# Patient Record
Sex: Male | Born: 1990 | Hispanic: No | Marital: Married | Smoking: Current some day smoker
Health system: Southern US, Community
[De-identification: ages and names within clinical notes are randomized; demographics above are authoritative.]

## PROBLEM LIST (undated history)

## (undated) HISTORY — PX: OTHER SURGICAL HISTORY: SHX169

---

## 2018-10-09 ENCOUNTER — Encounter: Payer: Self-pay | Admitting: Family Medicine

## 2018-10-09 ENCOUNTER — Ambulatory Visit (INDEPENDENT_AMBULATORY_CARE_PROVIDER_SITE_OTHER): Payer: Self-pay | Admitting: Family Medicine

## 2018-10-09 DIAGNOSIS — N469 Male infertility, unspecified: Secondary | ICD-10-CM | POA: Insufficient documentation

## 2018-10-09 NOTE — Progress Notes (Signed)
    Chief Complaint:  Wayne Long is a 28 y.o. male who presents for a telephone visit with a chief complaint of infertility and to establish care.   Assessment/Plan:  Infertility Has had previous semen analysis which is normal.  Will check requested lab work.  See orders section for further details.    Subjective:  HPI:  Infertility Patient has been trying to conceive a child with his wife for the past 5 years. They have not been successful. He has had work up in the past including semen analysis which was reportedly normal. They have been seeing a "natural" fertility specialist who recommended that he have additional blood work done.   ROS: Per HPI, otherwise a complete review of systems was negative.   PMH:  The following were reviewed and entered/updated in epic: History reviewed. No pertinent past medical history. Patient Active Problem List   Diagnosis Date Noted  . 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 Arm surgery      Family History  Problem Relation Age of Onset  . Diabetes Mother     Medications- reviewed and updated No current outpatient medications on file.   No current facility-administered medications for this visit.     Allergies-reviewed and updated Not on File  Social History   Socioeconomic History  . Marital status: Married    Spouse name: Not on file  . Number of children: Not on file  . Years of education: Not on file  . Highest education level: Not on file  Occupational History  . Not on file  Social Needs  . Financial resource strain: Not on file  . Food insecurity    Worry: Not on file    Inability: Not on file  . Transportation needs    Medical: Not on file    Non-medical: Not on file  Tobacco Use  . Smoking status: Current Some Day Smoker  . Smokeless tobacco: Never Used  Substance and Sexual Activity  . Alcohol use: Yes  . Drug use: Never  . Sexual activity: Not on file   Lifestyle  . Physical activity    Days per week: Not on file    Minutes per session: Not on file  . Stress: Not on file  Relationships  . Social Herbalist on phone: Not on file    Gets together: Not on file    Attends religious service: Not on file    Active member of club or organization: Not on file    Attends meetings of clubs or organizations: Not on file    Relationship status: Not on file  Other Topics Concern  . Not on file  Social History Narrative  . Not on file         Objective/Observations   NAD, Speaking in full sentences  Telephone Visit   I connected with Wayne Long on 10/09/18 at  9:20 AM EDT via telephone and verified that I am speaking with the correct person using two identifiers. I discussed the limitations of evaluation and management by telemedicine and the availability of in person appointments. The patient expressed understanding and agreed to proceed.   Patient location: Home Provider location: [place] participating in the virtual visit: Myself and Patient  A total of 20 minutes were spent on medical discussion.      Algis Greenhouse. Jerline Pain, MD 10/09/2018 9:41 AM

## 2018-10-10 ENCOUNTER — Other Ambulatory Visit: Payer: Self-pay

## 2018-10-10 LAB — CBC
HCT: 43 % (ref 39.0–52.0)
Hemoglobin: 14.4 g/dL (ref 13.0–17.0)
MCHC: 33.5 g/dL (ref 30.0–36.0)
MCV: 88.8 fl (ref 78.0–100.0)
Platelets: 168 10*3/uL (ref 150.0–400.0)
RBC: 4.84 Mil/uL (ref 4.22–5.81)
RDW: 13 % (ref 11.5–15.5)
WBC: 9.9 10*3/uL (ref 4.0–10.5)

## 2018-10-10 LAB — LIPID PANEL
Cholesterol: 147 mg/dL (ref 0–200)
HDL: 35.7 mg/dL — ABNORMAL LOW (ref >39.00)
LDL Cholesterol: 100 mg/dL — ABNORMAL HIGH (ref 0–99)
NonHDL: 111.3
Total CHOL/HDL Ratio: 4
Triglycerides: 58 mg/dL (ref 0.0–149.0)
VLDL: 11.6 mg/dL (ref 0.0–40.0)

## 2018-10-10 LAB — VITAMIN D 25 HYDROXY (VIT D DEFICIENCY, FRACTURES): VITD: 12.09 ng/mL — ABNORMAL LOW (ref 30.00–100.00)

## 2018-10-10 LAB — T3, FREE: T3, Free: 3.2 pg/mL (ref 2.3–4.2)

## 2018-10-10 LAB — COMPREHENSIVE METABOLIC PANEL
ALT: 12 U/L (ref 0–53)
AST: 13 U/L (ref 0–37)
Albumin: 4 g/dL (ref 3.5–5.2)
Alkaline Phosphatase: 48 U/L (ref 39–117)
BUN: 12 mg/dL (ref 6–23)
CO2: 26 mEq/L (ref 19–32)
Calcium: 9.2 mg/dL (ref 8.4–10.5)
Chloride: 107 mEq/L (ref 96–112)
Creatinine, Ser: 0.9 mg/dL (ref 0.40–1.50)
GFR: 98.86 mL/min (ref >60.00)
Glucose, Bld: 78 mg/dL (ref 70–99)
Potassium: 4.3 mEq/L (ref 3.5–5.1)
Sodium: 141 mEq/L (ref 135–145)
Total Bilirubin: 0.4 mg/dL (ref 0.2–1.2)
Total Protein: 6.6 g/dL (ref 6.0–8.3)

## 2018-10-10 LAB — SEDIMENTATION RATE: Sed Rate: 17 mm/hr — ABNORMAL HIGH (ref 0–15)

## 2018-10-10 LAB — IBC + FERRITIN
Ferritin: 101.1 ng/mL (ref 22.0–322.0)
Iron: 111 ug/dL (ref 42–165)
Saturation Ratios: 51.2 % — ABNORMAL HIGH (ref 20.0–50.0)
Transferrin: 155 mg/dL — ABNORMAL LOW (ref 212.0–360.0)

## 2018-10-10 LAB — VITAMIN B12: Vitamin B-12: 489 pg/mL (ref 211–911)

## 2018-10-10 LAB — C-REACTIVE PROTEIN: CRP: <1 mg/dL (ref 0.5–20.0)

## 2018-10-10 LAB — HEMOGLOBIN A1C: Hgb A1c MFr Bld: 5.9 % (ref 4.6–6.5)

## 2018-10-10 LAB — T4, FREE: Free T4: 0.95 ng/dL (ref 0.60–1.60)

## 2018-10-14 LAB — HOMOCYSTEINE: Homocysteine: 8.8 umol/L (ref <11.4)

## 2018-10-14 LAB — TESTOS,TOTAL,FREE AND SHBG (FEMALE)
Free Testosterone: 113.7 pg/mL (ref 35.0–155.0)
Sex Hormone Binding: 59 nmol/L — ABNORMAL HIGH (ref 10–50)
Testosterone, Total, LC-MS-MS: 812 ng/dL (ref 250–1100)

## 2018-10-14 LAB — THYROGLOBULIN ANTIBODY: Thyroglobulin Ab: <1 IU/mL (ref <=1)

## 2018-10-14 LAB — INSULIN, RANDOM

## 2018-10-14 LAB — PROLACTIN: Prolactin: 7 ng/mL (ref 2.0–18.0)

## 2018-10-14 LAB — FSH/LH
FSH: 6.7 m[IU]/mL (ref 1.6–8.0)
LH: 6.2 m[IU]/mL (ref 1.5–9.3)

## 2018-10-14 LAB — ESTRADIOL: Estradiol: 28 pg/mL (ref <=39)

## 2018-10-14 LAB — THYROID PEROXIDASE ANTIBODY: Thyroperoxidase Ab SerPl-aCnc: <1 IU/mL (ref <9)

## 2018-10-20 ENCOUNTER — Telehealth: Payer: Self-pay | Admitting: Physical Therapy

## 2018-10-20 NOTE — Telephone Encounter (Signed)
Still waiting for all labs to be completed and reviewed.

## 2018-10-20 NOTE — Telephone Encounter (Signed)
Copied from [place] [phone] [id number]. Topic: General - Other >> Oct 20, 2018  1:14 PM Nils Flack wrote: Reason for CRM: [phone] Pleas call with lab results

## 2018-12-10 ENCOUNTER — Telehealth: Payer: Self-pay | Admitting: Family Medicine

## 2018-12-11 ENCOUNTER — Other Ambulatory Visit: Payer: Self-pay

## 2018-12-11 ENCOUNTER — Telehealth (INDEPENDENT_AMBULATORY_CARE_PROVIDER_SITE_OTHER): Payer: Self-pay | Admitting: Family Medicine

## 2018-12-11 DIAGNOSIS — N469 Male infertility, unspecified: Secondary | ICD-10-CM

## 2018-12-11 NOTE — Progress Notes (Signed)
Virtual Visit via Video Note  I connected with Wayne Long  on 12/11/18 at 11:00 AM EDT by a video enabled telemedicine application and verified that I am speaking with the correct person using two identifiers.  Location patient: home Location provider:work or home office Persons participating in the virtual visit: patient, provider  I discussed the limitations of evaluation and management by telemedicine and the availability of in person appointments. The patient expressed understanding and agreed to proceed.   HPI:  Acute visit for [place]: requesting RX for doxycycline for BV in his wife -He and wife are seeing a fertility specialist who advised this, but can not prescribe it for him - [facility], [facility] - in [place] -100mg  of doxy twice for 28 days for recurrent BV in his wife -patient has no symptoms - denies penile irritation or dicharge -reports he was tested for BV and has none per the test -denies STI infection -wife is also on a 28 day course of doxy  ROS: See pertinent positives and negatives per HPI.  No past medical history on file.  Past Surgical History:  Procedure Laterality Date  . Arm surgery     Broken Arm    Family History  Problem Relation Age of Onset  . Diabetes Mother   . Pulmonary embolism Brother     SOCIAL HX: see hpi  No current outpatient medications on file.  EXAM:  VITALS per patient if applicable:  GENERAL: alert, oriented, appears well and in no acute distress  HEENT: atraumatic, conjunttiva clear, no obvious abnormalities on inspection of external nose and ears  NECK: normal movements of the head and neck  LUNGS: on inspection no signs of respiratory distress, breathing rate appears normal, no obvious gross SOB, gasping or wheezing  CV: no obvious cyanosis  MS: moves all visible extremities without noticeable abnormality  PSYCH/NEURO: pleasant and cooperative, no obvious depression or anxiety, speech and thought  processing grossly intact  ASSESSMENT AND PLAN:  Discussed the following assessment and plan:  Infertility male  Discussed the current local standard of care for BV in terms of treatment of male partner. Patient has no symptoms and it is unclear if he has any diagnosed male fertility issue. He reports he himself was tested for BV and that was negative - I do not have those results. Also, we discussed the risks of a lengthy treatment course with doxycycline. I am not sure that the risks > the benefits and this is outside of the normal prescription practices for a telemedicine visit. He was not sure if he wanted to take the medication after our discussion. I did offer to notify his PCP of this request to see if he feels comfortable prescribing this or has any further knowledge in this matter. I also will do a literature search myself to see if I can find good data to support this, if so told him I will reach back out to him.   I discussed the assessment and treatment plan with the patient. The patient was provided an opportunity to ask questions and all were answered. The patient agreed with the plan and demonstrated an understanding of the instructions.   The patient was advised to call back or seek an in-person evaluation if the symptoms worsen or if the condition fails to improve as anticipated.   Lucretia Kern, DO

## 2019-01-08 ENCOUNTER — Other Ambulatory Visit: Payer: Self-pay

## 2019-01-08 DIAGNOSIS — Z20822 Contact with and (suspected) exposure to covid-19: Secondary | ICD-10-CM

## 2019-01-09 LAB — NOVEL CORONAVIRUS, NAA: SARS-CoV-2, NAA: NOT DETECTED

## 2019-02-23 ENCOUNTER — Encounter: Payer: Self-pay | Admitting: Family Medicine

## 2019-02-23 ENCOUNTER — Other Ambulatory Visit: Payer: Self-pay

## 2019-02-23 DIAGNOSIS — N469 Male infertility, unspecified: Secondary | ICD-10-CM

## 2019-02-24 ENCOUNTER — Telehealth: Payer: Self-pay | Admitting: Family Medicine

## 2019-02-24 NOTE — Telephone Encounter (Signed)
Rica Records, with [facility], calling about [place] order sent. She inquired if Dr. Jerline Pain would like this done with or without doppler.

## 2019-02-24 NOTE — Telephone Encounter (Signed)
Notified Zena with Doppler

## 2019-02-24 NOTE — Telephone Encounter (Signed)
See note

## 2019-02-26 ENCOUNTER — Ambulatory Visit
Admission: RE | Admit: 2019-02-26 | Discharge: 2019-02-26 | Disposition: A | Discharge status: Home / Self Care | Payer: No Typology Code available for payment source | Source: Ambulatory Visit | Attending: Family Medicine | Admitting: Family Medicine

## 2019-02-26 DIAGNOSIS — N469 Male infertility, unspecified: Secondary | ICD-10-CM

## 2019-02-27 ENCOUNTER — Other Ambulatory Visit: Payer: Self-pay

## 2019-02-27 ENCOUNTER — Encounter: Payer: Self-pay | Admitting: Family Medicine

## 2019-02-27 DIAGNOSIS — N469 Male infertility, unspecified: Secondary | ICD-10-CM

## 2019-02-27 NOTE — Progress Notes (Signed)
Please inform patient of the following:  He has a small varicocele - do not think this would cause any issues, though recommend referral to urology to discuss implications for fertility if he wishes.  Algis Greenhouse. Jerline Pain, MD 02/27/2019 10:12 AM

## 2019-05-19 ENCOUNTER — Telehealth: Payer: Self-pay | Admitting: Family Medicine

## 2019-05-19 ENCOUNTER — Encounter: Payer: Self-pay | Admitting: Family Medicine

## 2019-05-19 NOTE — Telephone Encounter (Signed)
We have had patients come in to have blood drawn without labs before - please check with labs to make sure its ok.  Katina Degree. Jimmey Ralph, MD 05/19/2019 4:14 PM

## 2019-05-19 NOTE — Telephone Encounter (Signed)
Please advise 

## 2019-05-19 NOTE — Telephone Encounter (Signed)
Patient's wife is calling saying that they are doing fertility treatment and that her husband has to have a genetic test done and they have sent them a kit to them for him to have blood drawn and send back to the facility. Is this something we do? The kit is named "expanded career screening"

## 2019-05-20 NOTE — Telephone Encounter (Signed)
Sent mychart message not able to draw at this lab

## 2019-05-22 ENCOUNTER — Other Ambulatory Visit: Payer: No Typology Code available for payment source

## 2019-07-10 ENCOUNTER — Ambulatory Visit: Payer: BLUE CROSS/BLUE SHIELD | Attending: Internal Medicine

## 2019-07-10 DIAGNOSIS — Z23 Encounter for immunization: Secondary | ICD-10-CM

## 2019-07-10 NOTE — Progress Notes (Signed)
   Covid-19 Vaccination Clinic  Name:  Wayne Long    MRN: [id number] DOB: 1990/09/18  07/10/2019  Mr. Wayne Long was observed post Covid-19 immunization for 15 minutes without incident. He was provided with Vaccine Information Sheet and instruction to access the V-Safe system.   Mr. Wayne Long was instructed to call 911 with any severe reactions post vaccine: Marland Kitchen Difficulty breathing  . Swelling of face and throat  . A fast heartbeat  . A bad rash all over body  . Dizziness and weakness   Immunizations Administered    Name Date Dose VIS Date Route   Pfizer COVID-19 Vaccine 07/10/2019 12:32 PM 0.3 mL 04/10/2019 Intramuscular   Manufacturer: [employer], [employer]   Lot: [id number]   NDC: [address]-1

## 2019-08-03 ENCOUNTER — Ambulatory Visit: Payer: BLUE CROSS/BLUE SHIELD | Attending: Internal Medicine

## 2019-08-03 DIAGNOSIS — Z23 Encounter for immunization: Secondary | ICD-10-CM

## 2019-08-03 NOTE — Progress Notes (Signed)
   Covid-19 Vaccination Clinic  Name:  Wayne Long    MRN: [id number] DOB: Jun 23, 1990  08/03/2019  Wayne Long was observed post Covid-19 immunization for 15 minutes without incident. He was provided with Vaccine Information Sheet and instruction to access the V-Safe system.   Wayne Long was instructed to call 911 with any severe reactions post vaccine: Marland Kitchen Difficulty breathing  . Swelling of face and throat  . A fast heartbeat  . A bad rash all over body  . Dizziness and weakness   Immunizations Administered    Name Date Dose VIS Date Route   Pfizer COVID-19 Vaccine 08/03/2019  2:57 PM 0.3 mL 04/10/2019 Intramuscular   Manufacturer: [employer], [employer]   Lot: [id number]   NDC: [address]-4

## 2019-09-07 ENCOUNTER — Telehealth (INDEPENDENT_AMBULATORY_CARE_PROVIDER_SITE_OTHER): Payer: BLUE CROSS/BLUE SHIELD | Admitting: Family Medicine

## 2019-09-07 ENCOUNTER — Encounter: Payer: Self-pay | Admitting: Family Medicine

## 2019-09-07 VITALS — Ht 70.0 in | Wt 145.0 lb

## 2019-09-07 DIAGNOSIS — R05 Cough: Secondary | ICD-10-CM

## 2019-09-07 DIAGNOSIS — J029 Acute pharyngitis, unspecified: Secondary | ICD-10-CM

## 2019-09-07 DIAGNOSIS — R059 Cough, unspecified: Secondary | ICD-10-CM

## 2019-09-07 MED ORDER — BENZONATATE 200 MG PO CAPS: 200.0000 mg | ORAL_CAPSULE | Freq: Two times a day (BID) | ORAL | 0 refills | Status: DC | PRN

## 2019-09-07 MED ORDER — DICLOFENAC SODIUM 75 MG PO TBEC: 75.0000 mg | DELAYED_RELEASE_TABLET | Freq: Two times a day (BID) | ORAL | 0 refills | Status: DC

## 2019-09-07 MED ORDER — AZITHROMYCIN 250 MG PO TABS: ORAL_TABLET | ORAL | 0 refills | Status: DC

## 2019-09-07 NOTE — Progress Notes (Signed)
   Trevor Varnell is a 29 y.o. male who presents today for a virtual office visit.  Assessment/Plan:  Cough / Sore Throat No red flags.  Covid test negative.  Concern for possible bacterial superinfection given the symptoms that worsened after improving for a few days.  Will start azithromycin.  Also start Tessalon for cough.  Will start diclofenac for his back pain.  Discussed reasons return to care.  Follow-up as needed.     Subjective:  HPI:  Symptoms started 4 days ago.  Symptoms include sore throat, cough, back pain, and chills.  No sick contacts.  Was tested for Covid and was negative.  No shortness of breath.        Objective/Observations  Physical Exam: Gen: NAD, resting comfortably Pulm: Normal work of breathing Neuro: Grossly normal, moves all extremities Psych: Normal affect and thought content  Virtual Visit via Video   I connected with Burtis Bartosik on 09/07/19 at  3:40 PM EDT by a video enabled telemedicine application and verified that I am speaking with the correct person using two identifiers. The limitations of evaluation and management by telemedicine and the availability of in person appointments were discussed. The patient expressed understanding and agreed to proceed.   Patient location: Home Provider location: [place] [employer] Persons participating in the virtual visit: Myself and Patient     Katina Degree. Jimmey Ralph, MD 09/07/2019 3:53 PM

## 2019-09-08 ENCOUNTER — Other Ambulatory Visit: Payer: Self-pay

## 2019-09-08 ENCOUNTER — Telehealth: Payer: Self-pay | Admitting: Family Medicine

## 2019-09-08 NOTE — Telephone Encounter (Signed)
Patient called after hours last night and needs his medications   Azithromycin 250 MG Take 2 tabs day 1, then 1 tab daily   Benzonatate 200 mg Oral 2 times daily PRN   Diclofenac Sodium 75 mg Oral 2 times daily    sent to  [id number] [place] Otto, [place] - [address] AT [facility] OF [facility] & [address] Phone:  [phone]  Fax:  [phone]

## 2019-09-08 NOTE — Telephone Encounter (Signed)
Patient picked up Rx

## 2019-09-09 ENCOUNTER — Ambulatory Visit: Payer: BLUE CROSS/BLUE SHIELD | Admitting: Family Medicine

## 2019-09-10 ENCOUNTER — Telehealth (INDEPENDENT_AMBULATORY_CARE_PROVIDER_SITE_OTHER): Payer: BLUE CROSS/BLUE SHIELD | Admitting: Family Medicine

## 2019-09-10 DIAGNOSIS — R059 Cough, unspecified: Secondary | ICD-10-CM

## 2019-09-10 DIAGNOSIS — R05 Cough: Secondary | ICD-10-CM | POA: Diagnosis not present

## 2019-09-10 MED ORDER — HYDROCODONE-ACETAMINOPHEN 10-325 MG PO TABS: 1.0000 | ORAL_TABLET | Freq: Three times a day (TID) | ORAL | 0 refills | Status: AC | PRN

## 2019-09-10 NOTE — Progress Notes (Signed)
   Nikesh Krapf is a 29 y.o. male who presents today for a virtual office visit.  Assessment/Plan:  New/Acute Problems: Cough / Back Pain Consistent with prior flares of chronic back pain.  No red flags.  Exacerbated by recent upper respiratory infection and frequent cough.  Will start hydrocodone to help with both his cough and neck pain.  Will give only 15 tablets.  Database with no red flags.  If no improvement by next week, will need to obtain imaging.     Subjective:  HPI:  Patient seen 2 days ago for sore throat and back pain. Was started on azithromycin and tessalon. Was also started on diclofenac. Symptoms have not improved significantly.  Still has quite a bit of back pain.  Worse with cough.  Cough keeps him up at night.  Back pain also keeps him up at night.  Had something similar happen a couple years ago and was given a prescription for hydrocodone which worked very well for him.  He tolerated well without side effects..          Objective/Observations  Physical Exam: Gen: NAD, resting comfortably Pulm: Normal work of breathing Neuro: Grossly normal, moves all extremities Psych: Normal affect and thought content  Virtual Visit via Video   I connected with Tighe Aylesworth on 09/10/19 at 11:40 AM EDT by a video enabled telemedicine application and verified that I am speaking with the correct person using two identifiers. The limitations of evaluation and management by telemedicine and the availability of in person appointments were discussed. The patient expressed understanding and agreed to proceed.   Patient location: Home Provider location: [place] [employer] Persons participating in the virtual visit: Myself and Patient     Katina Degree. Jimmey Ralph, MD 09/10/2019 9:59 AM

## 2019-12-31 ENCOUNTER — Telehealth (INDEPENDENT_AMBULATORY_CARE_PROVIDER_SITE_OTHER): Payer: BLUE CROSS/BLUE SHIELD | Admitting: Family Medicine

## 2019-12-31 ENCOUNTER — Encounter: Payer: Self-pay | Admitting: Family Medicine

## 2019-12-31 DIAGNOSIS — R059 Cough, unspecified: Secondary | ICD-10-CM

## 2019-12-31 DIAGNOSIS — R0981 Nasal congestion: Secondary | ICD-10-CM

## 2019-12-31 DIAGNOSIS — R05 Cough: Secondary | ICD-10-CM

## 2019-12-31 DIAGNOSIS — Z8739 Personal history of other diseases of the musculoskeletal system and connective tissue: Secondary | ICD-10-CM

## 2019-12-31 MED ORDER — BENZONATATE 100 MG PO CAPS
100.0000 mg | ORAL_CAPSULE | Freq: Three times a day (TID) | ORAL | 0 refills | Status: DC | PRN
Start: 2019-12-31 — End: 2020-07-05

## 2019-12-31 NOTE — Progress Notes (Addendum)
Virtual Visit via Video Note  I connected with Wayne Long  on 12/31/19 at 12:00 PM EDT by a video enabled telemedicine application and verified that I am speaking with the correct person using two identifiers.  Location patient: home, [place] Location provider:work or home office Persons participating in the virtual visit: patient, provider  I discussed the limitations of evaluation and management by telemedicine and the availability of in person appointments. The patient expressed understanding and agreed to proceed.   HPI:  Acute visit for sinus issues: -started 2 days ago -symptoms include sinus congestion, cough, sore throat, some body aches  -denies fever, SOB, CP, NVD, loss of taste or smell, inability to get out of bed, inability to tol oral intake -denies any sick contacts, but he is around a lot of people daily -fully vaccinated for covid pfizer -has scheduled a [phone] test for tomorrow -has hx of back issues and sometimes gets back pain from coughing - not bad this time, but bad in the past when he had the flu and had to take hydrocodone for the pain as otc analgesics did not help  ROS: See pertinent positives and negatives per HPI.  History reviewed. No pertinent past medical history.  Past Surgical History:  Procedure Laterality Date  . Arm surgery     Broken Arm    Family History  Problem Relation Age of Onset  . Diabetes Mother   . Pulmonary embolism Brother     SOCIAL HX: see hpi   Current Outpatient Medications:  .  benzonatate (TESSALON PERLES) 100 MG capsule, Take 1 capsule (100 mg total) by mouth 3 (three) times daily as needed., Disp: 20 capsule, Rfl: 0  EXAM:  VITALS per patient if applicable:  GENERAL: alert, oriented, appears well and in no acute distress  HEENT: atraumatic, conjunttiva clear, no obvious abnormalities on inspection of external nose and ears  NECK: normal movements of the head and neck  LUNGS: on inspection no signs of respiratory  distress, breathing rate appears normal, no obvious gross SOB, gasping or wheezing  CV: no obvious cyanosis  MS: moves all visible extremities without noticeable abnormality  PSYCH/NEURO: pleasant and cooperative, no obvious depression or anxiety, speech and thought processing grossly intact  ASSESSMENT AND PLAN:  Discussed the following assessment and plan:  Cough  Sinus congestion  History of back pain  -we discussed possible serious and likely etiologies, options for evaluation and workup, limitations of telemedicine visit vs in person visit, treatment, treatment risks and precautions. Pt prefers to treat via telemedicine empirically rather then risking or undertaking an in person visit at this moment. Query viral URI, breakthrough COVID19 possible (he has test scheduled for tomorrow) vs other. Possible mild influenza, though seems less likely with no fever. Discussed nasal saline, short course nasal decongestant, Tessalon Rx sent for cough. Also, spent some time talking about options for his back. not having much pain currently and opted to try heat and topical menthol (tiger balm) and perhaps trying aleve if needed. Advised to stay home while sick and for a full 10 days if covid test is positive.  Advised to seek prompt follow up telemedicine visit or in person care if worsening, new symptoms arise, or if is not improving with treatment.   I discussed the assessment and treatment plan with the patient. The patient was provided an opportunity to ask questions and all were answered. The patient agreed with the plan and demonstrated an understanding of the instructions.    Damita Lack  Maudie Mercury, DO

## 2019-12-31 NOTE — Patient Instructions (Signed)
-  get the covid test as planned  -stay home while sick and if the COVID test is positive stay home for a full 10 days from the onset of symptoms plus one day of improving symptoms and no fever  -plenty of rest and fluids  -nasal saline wash 2-3 times daily (use prepackaged nasal saline or bottled/distilled water if making your own)   -can use AFRIN nasal spray for drainage and nasal congestion - but do NOT use longer then 3-4 days  -can use tylenol (in no history of liver disease) or ibuprofen (if no history of kidney disease, bowel bleeding or significant heart disease) as directed for aches and sorethroat  -in the winter time, using a humidifier at night is helpful (please follow cleaning instructions)  -if you are taking a cough medication - use only as directed, may also try a teaspoon of honey to coat the throat and throat lozenges. -I sent the medication(s) we discussed to your pharmacy: Meds ordered this encounter  Medications  . benzonatate (TESSALON PERLES) 100 MG capsule    Sig: Take 1 capsule (100 mg total) by mouth 3 (three) times daily as needed.    Dispense:  20 capsule    Refill:  0    -for sore throat, salt water gargles can help  -follow up if you have fevers, facial pain, tooth pain, difficulty breathing or are worsening or symptoms persist longer then expected    GET HELP RIGHT AWAY IF:   After the first few days, you feel you are getting worse.  You have questions about your medicine.  You have chills, shortness of breath, or red spit (mucus).  You have pain in the face for more then 1-2 days, especially when you bend forward.  You have a fever, puffy (swollen) neck, pain when you swallow, or white spots in the back of your throat.  You have a bad headache, ear pain, sinus pain, or chest pain.  You have a high-pitched whistling sound when you breathe in and out (wheezing).  You cough up blood.  You have sore muscles or a stiff neck. MAKE SURE YOU:    Understand these instructions.  Will watch your condition.  Will get help right away if you are not doing well or get worse. Document Released: 10/03/2007 Document Revised: 07/09/2011 Document Reviewed: 07/22/2013 [facility] Patient Information 2015 [place], [place]. This information is not intended to replace advice given to you by your health care provider. Make sure you discuss any questions you have with your health care provider.

## 2020-07-05 ENCOUNTER — Encounter: Payer: Self-pay | Admitting: Family Medicine

## 2020-07-05 ENCOUNTER — Ambulatory Visit (INDEPENDENT_AMBULATORY_CARE_PROVIDER_SITE_OTHER): Payer: Self-pay | Admitting: Family Medicine

## 2020-07-05 ENCOUNTER — Other Ambulatory Visit: Payer: Self-pay

## 2020-07-05 VITALS — BP 103/65 | HR 79 | Temp 98.3°F | Ht 70.0 in | Wt 152.6 lb

## 2020-07-05 DIAGNOSIS — G8929 Other chronic pain: Secondary | ICD-10-CM

## 2020-07-05 DIAGNOSIS — M542 Cervicalgia: Secondary | ICD-10-CM

## 2020-07-05 MED ORDER — DICLOFENAC SODIUM 75 MG PO TBEC
75.0000 mg | DELAYED_RELEASE_TABLET | Freq: Two times a day (BID) | ORAL | 0 refills | Status: DC
Start: 2020-07-05 — End: 2022-11-09

## 2020-07-05 MED ORDER — CYCLOBENZAPRINE HCL 10 MG PO TABS
10.0000 mg | ORAL_TABLET | Freq: Three times a day (TID) | ORAL | 0 refills | Status: DC | PRN
Start: 2020-07-05 — End: 2022-11-09

## 2020-07-05 NOTE — Patient Instructions (Signed)
It was very nice to see you today!  I think you are having some inflammation in your muscles.  Please start the 2 medications I will send in for you.  I will also place referral for you to see the sports medicine specialist.  Please let me know if your symptoms are not improving towards the end of the week.  Take care, Dr Jimmey Ralph  Please try these tips to maintain a healthy lifestyle:   Eat at least 3 REAL meals and 1-2 snacks per day.  Aim for no more than 5 hours between eating.  If you eat breakfast, please do so within one hour of getting up.    Each meal should contain half fruits/vegetables, one quarter protein, and one quarter carbs (no bigger than a computer mouse)   Cut down on sweet beverages. This includes juice, soda, and sweet tea.     Drink at least 1 glass of water with each meal and aim for at least 8 glasses per day   Exercise at least 150 minutes every week.

## 2020-07-05 NOTE — Assessment & Plan Note (Signed)
With acute flare. Seems to be mostly muscular given tenderness to palpation on exam.  His exam is overall reassuring with no signs of nerve impingement or neurologic impairment.  May have some element of muscular dyskinesia as well.  He has not had much success with chiropractor or physical therapy.  We will start diclofenac and Flexeril.  Offered injection for Toradol and Depo-Medrol however he deferred.  Will place referral to sports medicine given recurrent nature of symptoms.  Discussed reasons to return to care.

## 2020-07-05 NOTE — Progress Notes (Signed)
   Wayne Long is a 30 y.o. male who presents today for an office visit.  Assessment/Plan:  Chronic Problems Addressed Today: Chronic neck pain With acute flare. Seems to be mostly muscular given tenderness to palpation on exam.  His exam is overall reassuring with no signs of nerve impingement or neurologic impairment.  May have some element of muscular dyskinesia as well.  He has not had much success with chiropractor or physical therapy.  We will start diclofenac and Flexeril.  Offered injection for Toradol and Depo-Medrol however he deferred.  Will place referral to sports medicine given recurrent nature of symptoms.  Discussed reasons to return to care.    Subjective:  HPI:  Symptoms started several months ago.  Pain in right neck and then progressed into right shoulder and right upper back. Pain has been persistent. Hard for him to sleep. Pain at rest. Has tried chiropractor and physical therapy which made things worse. Tried ibuprofen which helped modestly. He fell on his back when he was 30 years old and is not sure if this is contributing but was noted to have a bulged disc when he was around 30 years old. Some pins and needles. No weakness         Objective:  Physical Exam: BP 103/65   Pulse 79   Temp 98.3 F (36.8 C) (Temporal)   Ht 5\' 10"  (1.778 m)   Wt 152 lb 9.6 oz (69.2 kg)   SpO2 98%   BMI 21.90 kg/m   Gen: No acute distress, resting comfortably MSK: -Back: No deformities.  Tender to palpation along cervical thoracic and lumbar paraspinal muscles.  Pain elicited with palpation and movement of right arm/shoulder -Right arm: No deformities.  Neurovascular intact distally.  Forage motion however this elicits pain in his back. Neuro: Grossly normal, moves all extremities Psych: Normal affect and thought content      Caleb M. , MD 07/05/2020 10:07 AM

## 2020-07-15 ENCOUNTER — Telehealth (INDEPENDENT_AMBULATORY_CARE_PROVIDER_SITE_OTHER): Payer: Self-pay | Admitting: Family Medicine

## 2020-07-15 VITALS — Ht 70.0 in | Wt 154.0 lb

## 2020-07-15 DIAGNOSIS — M542 Cervicalgia: Secondary | ICD-10-CM

## 2020-07-15 DIAGNOSIS — G8929 Other chronic pain: Secondary | ICD-10-CM

## 2020-07-15 MED ORDER — HYDROCODONE-ACETAMINOPHEN 10-325 MG PO TABS: 1.0000 | ORAL_TABLET | Freq: Three times a day (TID) | ORAL | 0 refills | Status: AC | PRN

## 2020-07-15 MED ORDER — PREDNISONE 50 MG PO TABS
ORAL_TABLET | ORAL | 0 refills | Status: DC
Start: 2020-07-15 — End: 2022-06-21

## 2020-07-15 NOTE — Assessment & Plan Note (Signed)
Patient seen 10 days ago for this.  Unfortunately symptoms have not resolved and have worsened.  No red flag signs or symptoms.  He was referred to sports medicine but is waiting until April until he can get health insurance.  He feels like the diclofenac potentially make things worse and may have caused headaches.  We will start prednisone today.  We will also give a small supply of Norco as this is worked well for him for previous flares in the past.  Patient is aware that this is not to be ongoing prescription.  Discussed reasons to return to care and seek emergent care.

## 2020-07-15 NOTE — Progress Notes (Signed)
   Wayne Long is a 30 y.o. male who presents today for a virtual office visit.  Assessment/Plan:  Chronic Problems Addressed Today: Chronic neck pain Patient seen 10 days ago for this.  Unfortunately symptoms have not resolved and have worsened.  No red flag signs or symptoms.  He was referred to sports medicine but is waiting until April until he can get health insurance.  He feels like the diclofenac potentially make things worse and may have caused headaches.  We will start prednisone today.  We will also give a small supply of Norco as this is worked well for him for previous flares in the past.  Patient is aware that this is not to be ongoing prescription.  Discussed reasons to return to care and seek emergent care.     Subjective:  HPI:  See a/p.      Objective/Observations  Physical Exam: Gen: NAD, resting comfortably Pulm: Normal work of breathing Neuro: Grossly normal, moves all extremities Psych: Normal affect and thought content  Virtual Visit via Video   I connected with Wayne Long on 07/15/20 at  3:00 PM EDT by a video enabled telemedicine application and verified that I am speaking with the correct person using two identifiers. The limitations of evaluation and management by telemedicine and the availability of in person appointments were discussed. The patient expressed understanding and agreed to proceed.   Patient location: Home Provider location: [facility] [employer] [facility] Persons participating in the virtual visit: Myself and Patient     Katina Degree. Jimmey Ralph, MD 07/15/2020 3:35 PM

## 2020-07-21 ENCOUNTER — Encounter: Payer: Self-pay | Admitting: Family Medicine

## 2020-07-21 NOTE — Telephone Encounter (Signed)
Patient return call stated still has back pain Needing pain medication

## 2020-07-21 NOTE — Telephone Encounter (Signed)
No Rx Hydrocodone in current medication list   LVM to return call

## 2020-07-22 MED ORDER — HYDROCODONE-ACETAMINOPHEN 10-325 MG PO TABS: 1.0000 | ORAL_TABLET | Freq: Three times a day (TID) | ORAL | 0 refills | Status: AC | PRN

## 2020-09-13 DIAGNOSIS — M5030 Other cervical disc degeneration, unspecified cervical region: Secondary | ICD-10-CM | POA: Diagnosis not present

## 2020-09-13 DIAGNOSIS — M47816 Spondylosis without myelopathy or radiculopathy, lumbar region: Secondary | ICD-10-CM | POA: Diagnosis not present

## 2020-09-13 DIAGNOSIS — M7918 Myalgia, other site: Secondary | ICD-10-CM | POA: Diagnosis not present

## 2020-09-13 DIAGNOSIS — M47812 Spondylosis without myelopathy or radiculopathy, cervical region: Secondary | ICD-10-CM | POA: Diagnosis not present

## 2020-09-14 DIAGNOSIS — B353 Tinea pedis: Secondary | ICD-10-CM | POA: Diagnosis not present

## 2020-09-14 DIAGNOSIS — L089 Local infection of the skin and subcutaneous tissue, unspecified: Secondary | ICD-10-CM | POA: Diagnosis not present

## 2020-09-14 DIAGNOSIS — L249 Irritant contact dermatitis, unspecified cause: Secondary | ICD-10-CM | POA: Diagnosis not present

## 2020-09-14 DIAGNOSIS — L639 Alopecia areata, unspecified: Secondary | ICD-10-CM | POA: Diagnosis not present

## 2020-09-14 DIAGNOSIS — D229 Melanocytic nevi, unspecified: Secondary | ICD-10-CM | POA: Diagnosis not present

## 2020-09-14 DIAGNOSIS — L905 Scar conditions and fibrosis of skin: Secondary | ICD-10-CM | POA: Diagnosis not present

## 2020-10-29 DIAGNOSIS — M5030 Other cervical disc degeneration, unspecified cervical region: Secondary | ICD-10-CM | POA: Diagnosis not present

## 2020-10-29 DIAGNOSIS — M545 Low back pain, unspecified: Secondary | ICD-10-CM | POA: Diagnosis not present

## 2020-10-29 DIAGNOSIS — M47812 Spondylosis without myelopathy or radiculopathy, cervical region: Secondary | ICD-10-CM | POA: Diagnosis not present

## 2020-10-29 DIAGNOSIS — M542 Cervicalgia: Secondary | ICD-10-CM | POA: Diagnosis not present

## 2020-10-29 DIAGNOSIS — M47816 Spondylosis without myelopathy or radiculopathy, lumbar region: Secondary | ICD-10-CM | POA: Diagnosis not present

## 2020-11-04 DIAGNOSIS — M5412 Radiculopathy, cervical region: Secondary | ICD-10-CM | POA: Diagnosis not present

## 2020-11-04 DIAGNOSIS — M542 Cervicalgia: Secondary | ICD-10-CM | POA: Diagnosis not present

## 2020-11-04 DIAGNOSIS — M545 Low back pain, unspecified: Secondary | ICD-10-CM | POA: Diagnosis not present

## 2020-11-04 DIAGNOSIS — G8929 Other chronic pain: Secondary | ICD-10-CM | POA: Diagnosis not present

## 2020-12-02 DIAGNOSIS — M545 Low back pain, unspecified: Secondary | ICD-10-CM | POA: Diagnosis not present

## 2020-12-02 DIAGNOSIS — M542 Cervicalgia: Secondary | ICD-10-CM | POA: Diagnosis not present

## 2020-12-02 DIAGNOSIS — M47812 Spondylosis without myelopathy or radiculopathy, cervical region: Secondary | ICD-10-CM | POA: Diagnosis not present

## 2020-12-02 DIAGNOSIS — G8929 Other chronic pain: Secondary | ICD-10-CM | POA: Diagnosis not present

## 2020-12-08 DIAGNOSIS — M5412 Radiculopathy, cervical region: Secondary | ICD-10-CM | POA: Diagnosis not present

## 2020-12-28 DIAGNOSIS — M542 Cervicalgia: Secondary | ICD-10-CM | POA: Diagnosis not present

## 2020-12-28 DIAGNOSIS — M7918 Myalgia, other site: Secondary | ICD-10-CM | POA: Diagnosis not present

## 2020-12-28 DIAGNOSIS — M5412 Radiculopathy, cervical region: Secondary | ICD-10-CM | POA: Diagnosis not present

## 2020-12-28 DIAGNOSIS — G8929 Other chronic pain: Secondary | ICD-10-CM | POA: Diagnosis not present

## 2021-03-02 DIAGNOSIS — J039 Acute tonsillitis, unspecified: Secondary | ICD-10-CM | POA: Diagnosis not present

## 2021-05-02 DIAGNOSIS — M7918 Myalgia, other site: Secondary | ICD-10-CM | POA: Diagnosis not present

## 2021-05-02 DIAGNOSIS — M5412 Radiculopathy, cervical region: Secondary | ICD-10-CM | POA: Diagnosis not present

## 2021-05-02 DIAGNOSIS — G8929 Other chronic pain: Secondary | ICD-10-CM | POA: Diagnosis not present

## 2021-05-02 DIAGNOSIS — M542 Cervicalgia: Secondary | ICD-10-CM | POA: Diagnosis not present

## 2021-05-29 IMAGING — US US SCROTUM W/ DOPPLER COMPLETE
1 series · 13 of 25 positions shown · non-contrast
Comparison: None.

CLINICAL DATA: Infertility

EXAM:
SCROTAL ULTRASOUND
DOPPLER ULTRASOUND OF THE TESTICLES
TECHNIQUE: Complete ultrasound examination of the testicles, epididymis, and
other scrotal structures was performed. Color and spectral Doppler
ultrasound were also utilized to evaluate blood flow to the
testicles.

[Series 1: us scrotum w/ doppler complete · 0.08mm/px · 13 of 55 slices shown]
[im 1/55]
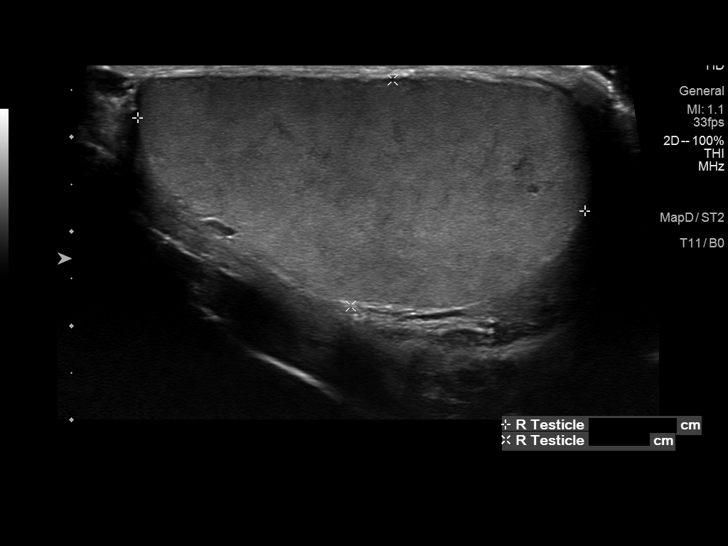
[im 5/55]
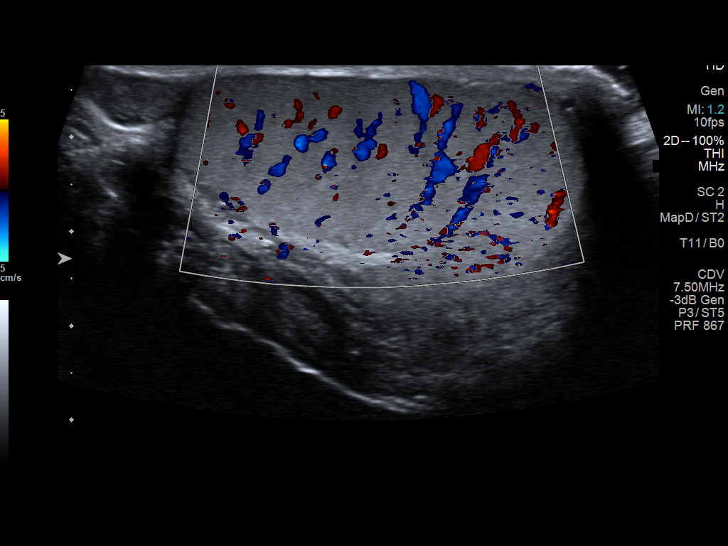
[im 10/55]
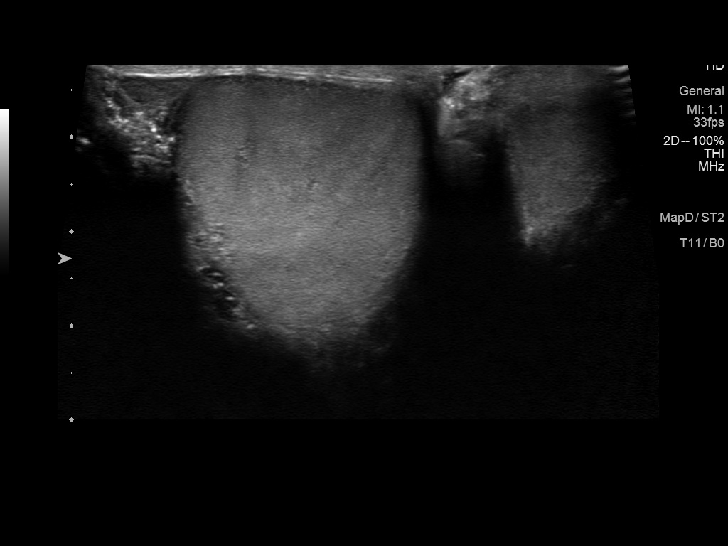
[im 14/55]
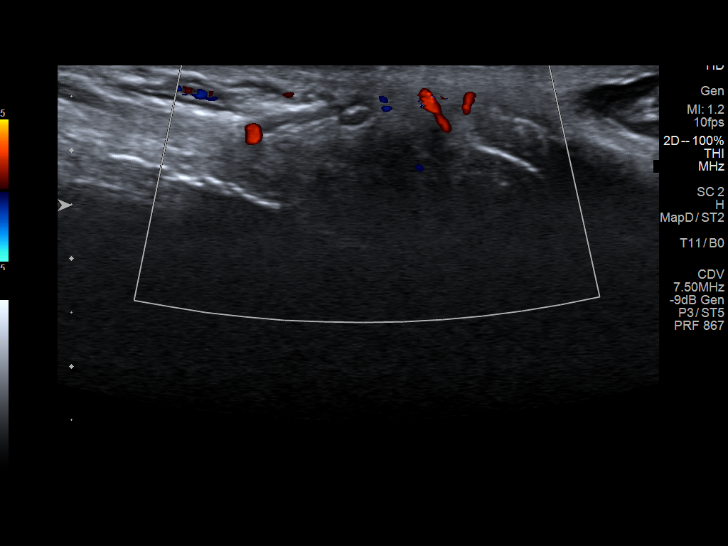
[im 19/55]
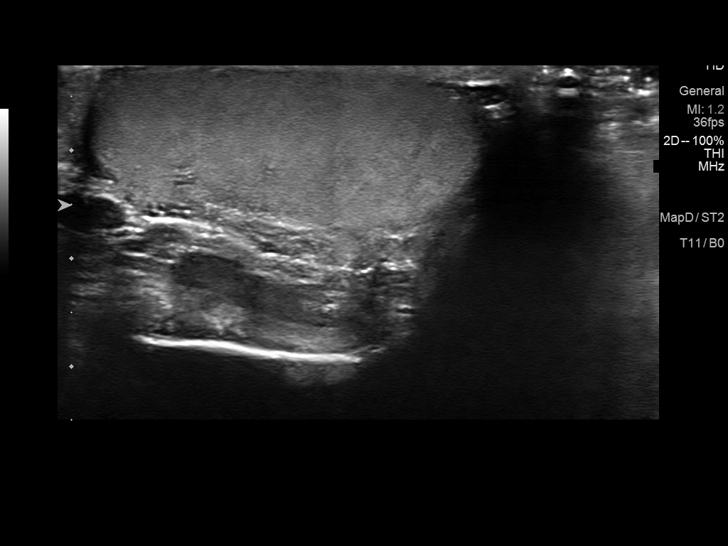
[im 23/55]
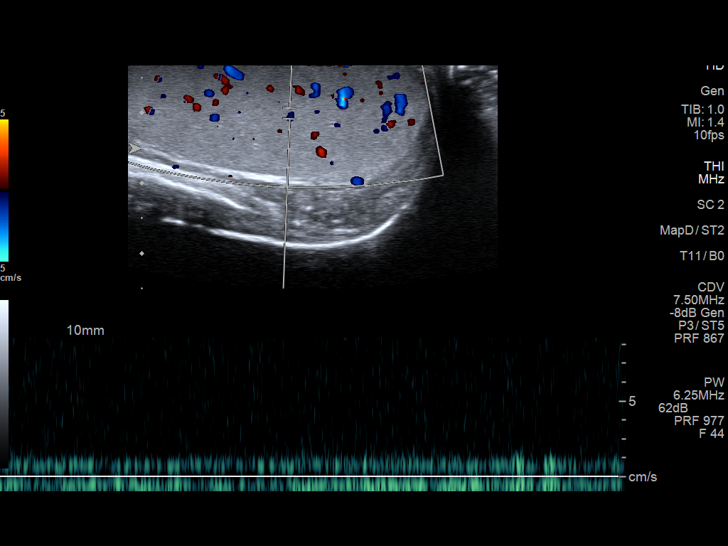
[im 28/55]
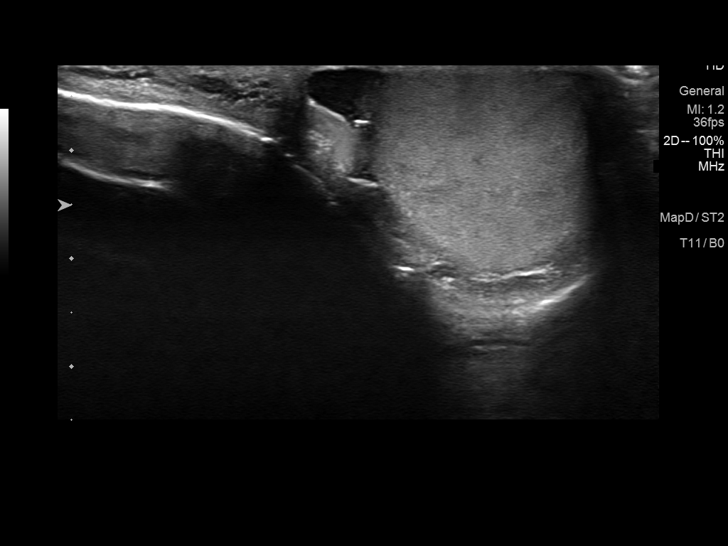
[im 32/55]
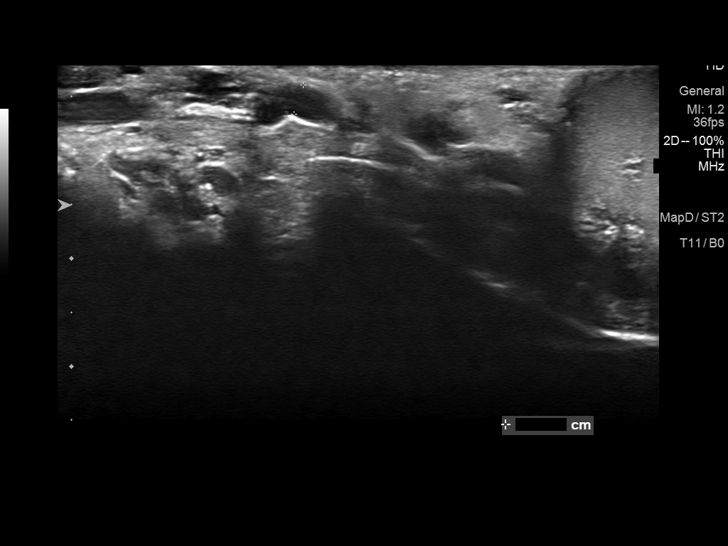
[im 37/55]
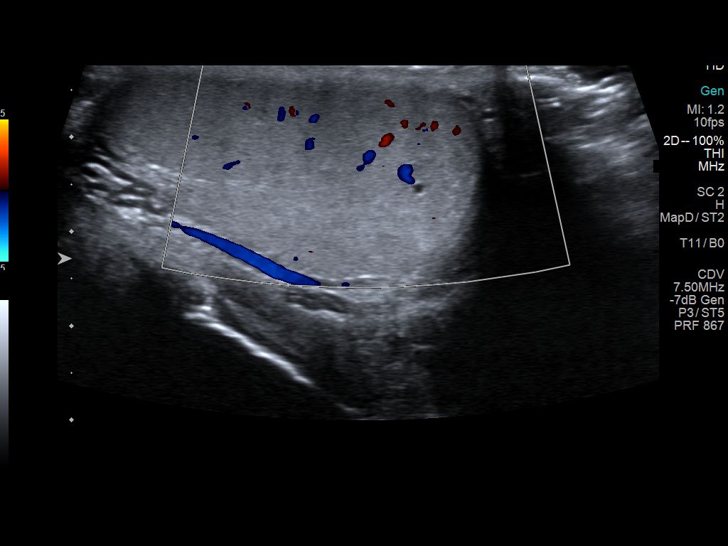
[im 41/55]
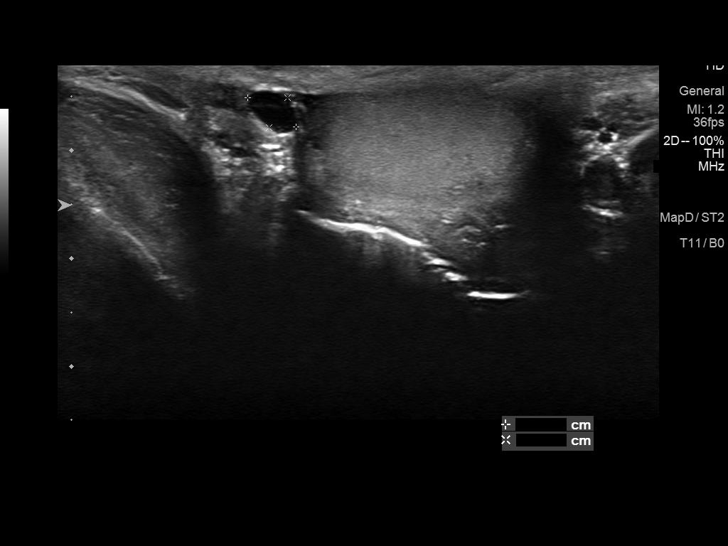
[im 46/55]
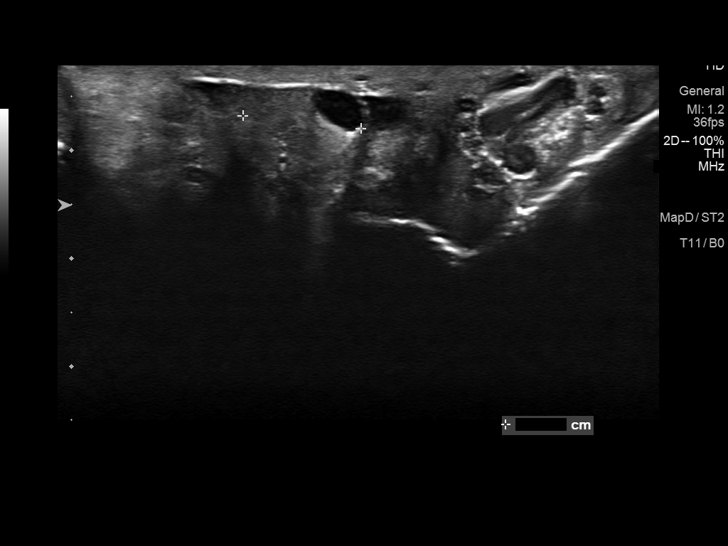
[im 50/55]
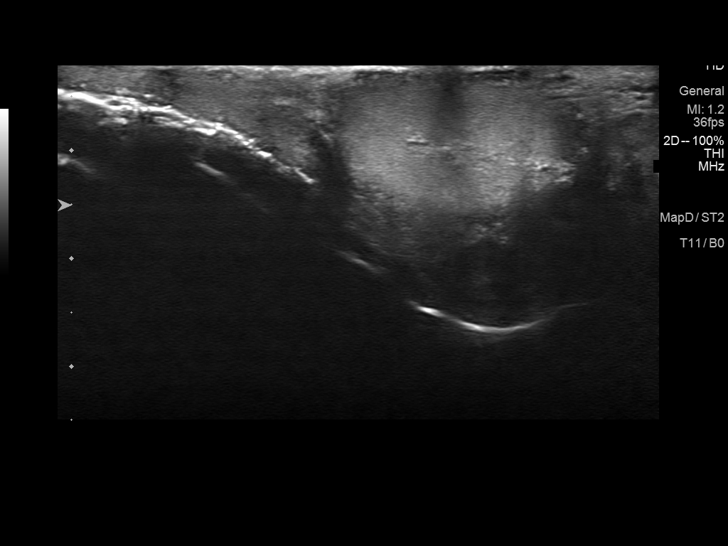
[im 55/55]
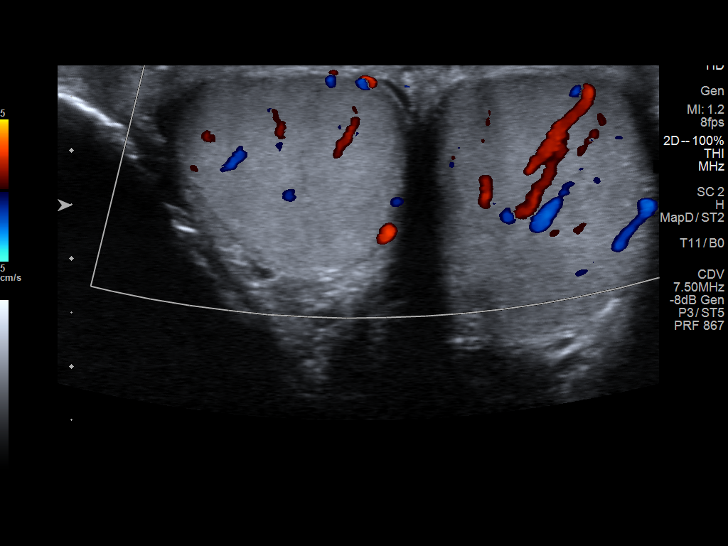

[13 of 25 positions shown; findings below may reference images not displayed]

FINDINGS: Right testicle

Measurements: 4.8 x 2.4 x 2.7 cm. No mass or microlithiasis
visualized.

Left testicle

Measurements: 4.1 x 1.9 x 2.6 cm. No mass or microlithiasis
visualized.

Right epididymis: Incidental 5 mm benign cyst of the epididymal
head. Otherwise normal in size and appearance.

Left epididymis:  Normal in size and appearance.

Hydrocele:  None visualized.

Varicocele: There is a left-sided varicocele with vessels measuring
up to 3 mm in caliber, without significant dynamic component on
Valsalva maneuver.

Pulsed Doppler interrogation of both testes demonstrates normal low
resistance arterial and venous waveforms bilaterally.
IMPRESSION: 1. Normal ultrasound size and appearance of the testicles. Arterial
and venous Doppler flow is present bilaterally.

2. Left-sided varicocele with vessels measuring up to 3 mm in
caliber, without significant dynamic component on Valsalva maneuver.

## 2021-06-13 DIAGNOSIS — M545 Low back pain, unspecified: Secondary | ICD-10-CM | POA: Diagnosis not present

## 2021-06-13 DIAGNOSIS — M5412 Radiculopathy, cervical region: Secondary | ICD-10-CM | POA: Diagnosis not present

## 2021-06-13 DIAGNOSIS — M7918 Myalgia, other site: Secondary | ICD-10-CM | POA: Diagnosis not present

## 2021-06-13 DIAGNOSIS — M542 Cervicalgia: Secondary | ICD-10-CM | POA: Diagnosis not present

## 2021-06-29 DIAGNOSIS — M5412 Radiculopathy, cervical region: Secondary | ICD-10-CM | POA: Diagnosis not present

## 2021-06-29 DIAGNOSIS — M5020 Other cervical disc displacement, unspecified cervical region: Secondary | ICD-10-CM | POA: Diagnosis not present

## 2021-06-29 DIAGNOSIS — R200 Anesthesia of skin: Secondary | ICD-10-CM | POA: Diagnosis not present

## 2022-01-22 ENCOUNTER — Encounter: Payer: Self-pay | Admitting: *Deleted

## 2022-04-12 ENCOUNTER — Encounter: Payer: Self-pay | Admitting: *Deleted

## 2022-05-30 ENCOUNTER — Ambulatory Visit: Payer: Self-pay | Admitting: Internal Medicine

## 2022-05-31 ENCOUNTER — Ambulatory Visit (INDEPENDENT_AMBULATORY_CARE_PROVIDER_SITE_OTHER): Payer: Self-pay | Admitting: Internal Medicine

## 2022-05-31 ENCOUNTER — Encounter: Payer: Self-pay | Admitting: Internal Medicine

## 2022-05-31 VITALS — BP 96/59 | HR 88 | Temp 98.6°F | Ht 70.0 in | Wt 148.8 lb

## 2022-05-31 DIAGNOSIS — M545 Low back pain, unspecified: Secondary | ICD-10-CM | POA: Insufficient documentation

## 2022-05-31 DIAGNOSIS — R634 Abnormal weight loss: Secondary | ICD-10-CM | POA: Insufficient documentation

## 2022-05-31 DIAGNOSIS — R630 Anorexia: Secondary | ICD-10-CM

## 2022-05-31 DIAGNOSIS — R5383 Other fatigue: Secondary | ICD-10-CM

## 2022-05-31 DIAGNOSIS — M542 Cervicalgia: Secondary | ICD-10-CM

## 2022-05-31 DIAGNOSIS — G8929 Other chronic pain: Secondary | ICD-10-CM

## 2022-05-31 DIAGNOSIS — K2970 Gastritis, unspecified, without bleeding: Secondary | ICD-10-CM | POA: Insufficient documentation

## 2022-05-31 DIAGNOSIS — B0229 Other postherpetic nervous system involvement: Secondary | ICD-10-CM | POA: Insufficient documentation

## 2022-05-31 DIAGNOSIS — F32A Depression, unspecified: Secondary | ICD-10-CM | POA: Insufficient documentation

## 2022-05-31 MED ORDER — MIRTAZAPINE 7.5 MG PO TABS: 7.5000 mg | ORAL_TABLET | Freq: Every day | ORAL | 2 refills | Status: DC

## 2022-05-31 MED ORDER — PREGABALIN 150 MG PO CAPS: 150.0000 mg | ORAL_CAPSULE | Freq: Three times a day (TID) | ORAL | 1 refills | Status: DC

## 2022-05-31 MED ORDER — OMEPRAZOLE 20 MG PO CPDR: 20.0000 mg | DELAYED_RELEASE_CAPSULE | Freq: Every day | ORAL | 0 refills | Status: DC

## 2022-05-31 NOTE — Assessment & Plan Note (Signed)
Declined XR Declined mm relaxer Not interested opioids Had mri in 10 month(s) ago Will do just Lyrica for now.

## 2022-05-31 NOTE — Assessment & Plan Note (Signed)
Gave Lyrica Offreed XR declined for now

## 2022-05-31 NOTE — Progress Notes (Signed)
[facility]: [id number]   Routine Medical Office Visit  Patient:  Wayne Long      Age: 32 y.o.       Sex:  male  Date:   05/31/2022  PCP:    Vivi Barrack, [place] Provider: Loralee Pacas, MD   Problem Focused Charting:   Medical Decision Making per Assessment/Plan    Multiple issues that are mostly subacute listed as concerns today, most  pain coming from the shingles and abdominal pain, but he brought up back and neck pain as well.  His biggest complaint maybe 1 year(s) fatigue.   [place] was seen today for fatigue, herpes zoster, abdominal pain, decreased appetite, weight loss, back pain and neck pain.  Other fatigue -     Iron, TIBC and Ferritin Panel -     B12 and Folate Panel -     VITAMIN D 25 Hydroxy (Vit-D Deficiency, Fractures) -     TSH  Postherpetic neuralgia -     Pregabalin; Take 1 capsule (150 mg total) by mouth in the morning, at noon, and at bedtime.  Dispense: 90 capsule; Refill: 1  Depression, unspecified depression type Overview:    05/31/2022    2:53 PM 07/05/2020    9:45 AM  Depression screen PHQ 2/9  Decreased Interest 0 0  Down, Depressed, Hopeless 2 0  PHQ - 2 Score 2 0  Altered sleeping 3   Tired, decreased energy 3   Change in appetite 3   Feeling bad or failure about yourself  0   Trouble concentrating 1   Moving slowly or fidgety/restless 1   Suicidal thoughts 0   PHQ-9 Score 13   Difficult doing work/chores Very difficult      Assessment & Plan: Not clear how its associated with fatigue Encouraged mirtazapine, for gain weight, and take vitamins... suspect vitamin deficiency   Orders: -     Mirtazapine; Take 1 tablet (7.5 mg total) by mouth at bedtime.  Dispense: 30 tablet; Refill: 2  Unintentional weight loss -     CBC -     Comprehensive metabolic panel -     H85 and Folate Panel -     VITAMIN D 25 Hydroxy (Vit-D Deficiency, Fractures) -     DG Chest 2 View; Future  Decreased  appetite  Chronic low back pain, unspecified back pain laterality, unspecified whether sciatica present Assessment & Plan: Gave Lyrica Offreed XR declined for now   Gastritis without bleeding, unspecified chronicity, unspecified gastritis type Overview: Indigestion and discomfort in wake of shingles Suspicious for gastritis with or without nonbleeding ulcer   Assessment & Plan: Advised omeprezole Explained stress ulcer and gastritis pathology  Orders: -     Omeprazole; Take 1 capsule (20 mg total) by mouth daily. Taper off as tolerated after 15 days. For suspected stress ulcer  Dispense: 30 capsule; Refill: 0  Chronic neck pain Assessment & Plan: Declined XR Declined mm relaxer Not interested opioids Had mri in 10 month(s) ago Will do just Lyrica for now.      Subjective - Clinical Presentation:   Wayne Long is a 32 y.o. male  Patient Active Problem List   Diagnosis Date Noted   Postherpetic neuralgia 05/31/2022   Depression 05/31/2022   Gastritis without bleeding 05/31/2022   Decreased appetite 05/31/2022   Unintentional weight loss 05/31/2022   Chronic low back pain 05/31/2022   Chronic neck pain 07/05/2020   Infertility male 10/09/2018  History reviewed. No pertinent past medical history.  Outpatient Medications Prior to Visit  Medication Sig   cyclobenzaprine (FLEXERIL) 10 MG tablet Take 1 tablet (10 mg total) by mouth 3 (three) times daily as needed for muscle spasms.   diclofenac (VOLTAREN) 75 MG EC tablet Take 1 tablet (75 mg total) by mouth 2 (two) times daily.   predniSONE (DELTASONE) 50 MG tablet Take 1 tablet daily for 5 days.   No facility-administered medications prior to visit.    Chief Complaint  Patient presents with   Fatigue    No energy to do anything   Herpes Zoster    Drying up but still painful, right side especially (including internal organs).  Wasn't able to sleep for whole week   Abdominal Pain    When eating or drinking  following diarrhea from four days ago.   Decreased appetite    No appetite for a close to  a year. Eats minimally and has lost weight. Requests appetite booster.   Weight Loss    Associated with low appetite Wt Readings from Last 10 Encounters: 05/31/22 : 148 lb 12.8 oz (67.5 kg) 07/15/20 : 154 lb (69.9 kg) 07/05/20 : 152 lb 9.6 oz (69.2 kg) 09/07/19 : 145 lb (65.8 kg) 10/09/18 : 146 lb (66.2 kg)  [place] Dr. And gained 15 pounds with appetite booster   Back Pain    Bad disk Saw [facility] 1 year(s) ago Was on Lyrica 450 mg daily but discussed    Neck Pain    Bad disk History injection -> shaking at pain clinic Comes and goes Keeping pressure off back   HPI taken with chief complaint          Objective:  Physical Exam  BP (!) 96/59 (BP Location: Right Arm, Patient Position: Sitting)   Pulse 88   Temp 98.6 F (37 C) (Temporal)   Ht 5\' 10"  (1.778 m)   Wt 148 lb 12.8 oz (67.5 kg)   SpO2 98%   BMI 21.35 kg/m  Well developed, well nourished  by BMI criteria but truncal adiposity (waist circumference or caliper) should be used instead. Wt Readings from Last 10 Encounters:  05/31/22 148 lb 12.8 oz (67.5 kg)  07/15/20 154 lb (69.9 kg)  07/05/20 152 lb 9.6 oz (69.2 kg)  09/07/19 145 lb (65.8 kg)  10/09/18 146 lb (66.2 kg)   Vital signs reviewed.  Nursing notes reviewed. Weight trend reviewed. General Appearance:  Well developed, well nourished male in no acute distress.   Normal work of breathing at rest Musculoskeletal: All extremities are intact.  Neurological:  Awake, alert,  No obvious focal neurological deficits or cognitive impairments Psychiatric:  Appropriate mood, pleasant demeanor Problem-specific findings:  abdomen pain is in same dermatome as the shingles rash which is clearing up, very polite gentleman         Signed: Loralee Pacas, MD 05/31/2022 5:28 PM

## 2022-05-31 NOTE — Assessment & Plan Note (Signed)
Advised omeprezole Explained stress ulcer and gastritis pathology

## 2022-05-31 NOTE — Patient Instructions (Signed)
It was a pleasure seeing you today!  Your health and satisfaction are my top priorities. If you believe your experience today was worthy of a 5-star rating, I'd be grateful for your feedback! Loralee Pacas, MD   CHECKOUT CHECKLIST  []    Schedule next appointment(s):    Return in about 3 weeks (around 06/21/2022).  Any requested lab visits should be scheduled as appointments too  If you are not doing well:  Return to the office sooner Please bring all your medicine bottles to each appointment If your condition begins to worsen or become severe:  go to the emergency room or even call 911  []    Sign release of information authorizations: Any records we need for your care and to be your medical home  []    X-rays can be obtained at the  [facility] office. You can walk in M-F between 8:30am- noon or 1pm - 5pm. Tell them you are there for xrays ordered by me. They will send me the results, then I will let you know the results with instructions. Address: [address].  The Xray department is located in the basement.     []   (Optional):  Review your clinical notes on MyChart after they are completed.     Today's draft of the physician documented plan for today's visit: (final revisions will be visible on MyChart chart later)  Other fatigue -     Iron, TIBC and Ferritin Panel -     B12 and Folate Panel -     VITAMIN D 25 Hydroxy (Vit-D Deficiency, Fractures) -     TSH  Postherpetic neuralgia -     Pregabalin; Take 1 capsule (150 mg total) by mouth in the morning, at noon, and at bedtime.  Dispense: 90 capsule; Refill: 1  Depression, unspecified depression type -     Mirtazapine; Take 1 tablet (7.5 mg total) by mouth at bedtime.  Dispense: 30 tablet; Refill: 2  Unintentional weight loss -     CBC -     Comprehensive metabolic panel -     R67 and Folate Panel -     VITAMIN D 25 Hydroxy (Vit-D Deficiency, Fractures) -     DG Chest 2 View; Future  Decreased  appetite  Chronic low back pain, unspecified back pain laterality, unspecified whether sciatica present  Gastritis without bleeding, unspecified chronicity, unspecified gastritis type -     Omeprazole; Take 1 capsule (20 mg total) by mouth daily. Taper off as tolerated after 15 days. For suspected stress ulcer  Dispense: 30 capsule; Refill: 0      QUESTIONS & CONCERNS: CLINICAL: please contact us via phone [phone] OR MyChart messaging  LAB & IMAGING:   We will call you if the results are significantly abnormal or you don't use MyChart.  Most normal results will be posted to MyChart immediately and have a clinical review message by Dr. Randol Kern posted within 2-3 business days.   If you have not heard from [place] regarding the results in 2 weeks OR if you need priority reporting, please contact this office. MYCHART:  The fastest way to get your results and easiest way to stay in touch with [place] is by activating your My Chart account. Instructions are located on the last page of this paperwork.  BILLING: xray and lab orders are billed from separate companies and questions./concerns should be directed to the [facility].  For visit charges please discuss with our administrative  services COMPLAINTS:  please let Dr. Randol Kern know or see the [facility], by asking at the front desk: we want you to be satisfied with every experience and we would be grateful for the opportunity to address any problems

## 2022-05-31 NOTE — Assessment & Plan Note (Signed)
Not clear how its associated with fatigue Encouraged mirtazapine, for gain weight, and take vitamins... suspect vitamin deficiency

## 2022-06-01 LAB — COMPREHENSIVE METABOLIC PANEL
ALT: 11 U/L (ref 0–53)
AST: 13 U/L (ref 0–37)
Albumin: 4.2 g/dL (ref 3.5–5.2)
Alkaline Phosphatase: 57 U/L (ref 39–117)
BUN: 12 mg/dL (ref 6–23)
CO2: 26 mEq/L (ref 19–32)
Calcium: 9 mg/dL (ref 8.4–10.5)
Chloride: 109 mEq/L (ref 96–112)
Creatinine, Ser: 1.23 mg/dL (ref 0.40–1.50)
GFR: 76.88 mL/min (ref >60.00)
Glucose, Bld: 88 mg/dL (ref 70–99)
Potassium: 4.1 mEq/L (ref 3.5–5.1)
Sodium: 143 mEq/L (ref 135–145)
Total Bilirubin: 0.2 mg/dL (ref 0.2–1.2)
Total Protein: 6.9 g/dL (ref 6.0–8.3)

## 2022-06-01 LAB — TSH: TSH: 0.93 u[IU]/mL (ref 0.35–5.50)

## 2022-06-01 LAB — CBC
HCT: 42.6 % (ref 39.0–52.0)
Hemoglobin: 14.6 g/dL (ref 13.0–17.0)
MCHC: 34.2 g/dL (ref 30.0–36.0)
MCV: 88.9 fl (ref 78.0–100.0)
Platelets: 160 10*3/uL (ref 150.0–400.0)
RBC: 4.79 Mil/uL (ref 4.22–5.81)
RDW: 13.3 % (ref 11.5–15.5)
WBC: 10.2 10*3/uL (ref 4.0–10.5)

## 2022-06-01 LAB — VITAMIN D 25 HYDROXY (VIT D DEFICIENCY, FRACTURES): VITD: 7.52 ng/mL — ABNORMAL LOW (ref 30.00–100.00)

## 2022-06-01 LAB — IRON,TIBC AND FERRITIN PANEL
%SAT: 30 % (calc) (ref 20–48)
Ferritin: 89 ng/mL (ref 38–380)
Iron: 69 ug/dL (ref 50–180)
TIBC: 227 mcg/dL (calc) — ABNORMAL LOW (ref 250–425)

## 2022-06-01 LAB — B12 AND FOLATE PANEL
Folate: 6.4 ng/mL (ref >5.9)
Vitamin B-12: 473 pg/mL (ref 211–911)

## 2022-06-01 NOTE — Progress Notes (Signed)
Please notify him that I reviewed these results and my recommendations are:  Take 50k vitamin d2 weekly for 12 week(s) and recheck.  See Dr. Jerline Pain soon about the fatigue and vitamin D deficiency but the severe vitamin D deficiency can contribute to fatigue.   Ok to send any orders for labs/imaging/medications agreed upon. Loralee Pacas, MD  06/01/2022 1:01 PM

## 2022-06-01 NOTE — Progress Notes (Signed)
Total Iron Binding Content (TIBC) is a little below normal but this is nonspecific and not meaningful Rest of labs still pending but iron does not explain fatigue

## 2022-06-02 ENCOUNTER — Encounter: Payer: Self-pay | Admitting: Internal Medicine

## 2022-06-06 ENCOUNTER — Other Ambulatory Visit: Payer: Self-pay | Admitting: Internal Medicine

## 2022-06-06 DIAGNOSIS — E559 Vitamin D deficiency, unspecified: Secondary | ICD-10-CM

## 2022-06-06 MED ORDER — VITAMIN D (ERGOCALCIFEROL) 1.25 MG (50000 UNIT) PO CAPS: 50000.0000 [IU] | ORAL_CAPSULE | ORAL | 1 refills | Status: DC

## 2022-06-21 ENCOUNTER — Ambulatory Visit (INDEPENDENT_AMBULATORY_CARE_PROVIDER_SITE_OTHER): Payer: BC Managed Care – PPO | Admitting: Family Medicine

## 2022-06-21 ENCOUNTER — Encounter: Payer: Self-pay | Admitting: Family Medicine

## 2022-06-21 VITALS — BP 110/66 | HR 85 | Temp 97.8°F | Ht 70.0 in | Wt 146.4 lb

## 2022-06-21 DIAGNOSIS — E559 Vitamin D deficiency, unspecified: Secondary | ICD-10-CM | POA: Insufficient documentation

## 2022-06-21 DIAGNOSIS — G4700 Insomnia, unspecified: Secondary | ICD-10-CM | POA: Insufficient documentation

## 2022-06-21 DIAGNOSIS — M542 Cervicalgia: Secondary | ICD-10-CM

## 2022-06-21 DIAGNOSIS — G8929 Other chronic pain: Secondary | ICD-10-CM

## 2022-06-21 MED ORDER — VITAMIN D (ERGOCALCIFEROL) 1.25 MG (50000 UNIT) PO CAPS: 50000.0000 [IU] | ORAL_CAPSULE | ORAL | 1 refills | Status: DC

## 2022-06-21 MED ORDER — TRAZODONE HCL 50 MG PO TABS: 50.0000 mg | ORAL_TABLET | Freq: Every day | ORAL | 3 refills | Status: DC

## 2022-06-21 NOTE — Assessment & Plan Note (Signed)
He has not had much improvement with the Remeron.  We discussed treatment options.  Will try trazodone.  He can follow-up with me in 1 to 2 weeks.  If still no improvement would consider Ambien or Lunesta.  Discussed sleep hygiene.

## 2022-06-21 NOTE — Patient Instructions (Signed)
It was very nice to see you today!  Please try the trazodone.  Sending message in a couple weeks limit how you are doing.  Take care, Dr Jerline Pain  PLEASE NOTE:  If you had any lab tests, please let us know if you have not heard back within a few days. You may see your results on mychart before we have a chance to review them but we will give you a call once they are reviewed by [place].   If we ordered any referrals today, please let us know if you have not heard from their office within the next week.   If you had any urgent prescriptions sent in today, please check with the pharmacy within an hour of our visit to make sure the prescription was transmitted appropriately.   Please try these tips to maintain a healthy lifestyle:  Eat at least 3 REAL meals and 1-2 snacks per day.  Aim for no more than 5 hours between eating.  If you eat breakfast, please do so within one hour of getting up.   Each meal should contain half fruits/vegetables, one quarter protein, and one quarter carbs (no bigger than a computer mouse)  Cut down on sweet beverages. This includes juice, soda, and sweet tea.   Drink at least 1 glass of water with each meal and aim for at least 8 glasses per day  Exercise at least 150 minutes every week.

## 2022-06-21 NOTE — Progress Notes (Signed)
   Wayne Long is a 32 y.o. male who presents today for an office visit.  Assessment/Plan:  Chronic Problems Addressed Today: Insomnia He has not had much improvement with the Remeron.  We discussed treatment options.  Will try trazodone.  He can follow-up with me in 1 to 2 weeks.  If still no improvement would consider Ambien or Lunesta.  Discussed sleep hygiene.  Vitamin D deficiency Could be contributing to his fatigue.  Has not yet started vitamin D supplementation.  Will resend in today.  We can recheck in 3 to 6 months.  Chronic neck pain Takes Lyrica 150 mg 3 times daily.  Likely worsened recently due to his poor sleep quality.  Hopefully will improve with treatment for his insomnia as above.     Subjective:  HPI:  Patient here for follow-up.  We last saw him about a year ago however he saw a different provider here 2 weeks ago for insomnia and fatigue.  Was started on mirtazapine.  He has not felt like this has helped.  He had labs at that time also that showed vitamin D deficiency.   Since our last visit he had an episode of shingles and was seen at urgent care. Was treated for that with antivirals. Pain seems to be improving.        Objective:  Physical Exam: BP 110/66   Pulse 85   Temp 97.8 F (36.6 C) (Temporal)   Ht 5' 10"$  (1.778 m)   Wt 146 lb 6.4 oz (66.4 kg)   SpO2 100%   BMI 21.01 kg/m   Wt Readings from Last 3 Encounters:  06/21/22 146 lb 6.4 oz (66.4 kg)  05/31/22 148 lb 12.8 oz (67.5 kg)  07/15/20 154 lb (69.9 kg)    Gen: No acute distress, resting comfortably CV: Regular rate and rhythm with no murmurs appreciated Pulm: Normal work of breathing, clear to auscultation bilaterally with no crackles, wheezes, or rhonchi Neuro: Grossly normal, moves all extremities Psych: Normal affect and thought content      Rogenia Werntz M. Jerline Pain, MD 06/21/2022 12:13 PM

## 2022-06-21 NOTE — Assessment & Plan Note (Addendum)
Takes Lyrica 150 mg 3 times daily.  Likely worsened recently due to his poor sleep quality.  Hopefully will improve with treatment for his insomnia as above.

## 2022-06-21 NOTE — Assessment & Plan Note (Signed)
Could be contributing to his fatigue.  Has not yet started vitamin D supplementation.  Will resend in today.  We can recheck in 3 to 6 months.

## 2022-06-29 ENCOUNTER — Other Ambulatory Visit: Payer: Self-pay | Admitting: Internal Medicine

## 2022-06-29 DIAGNOSIS — K2970 Gastritis, unspecified, without bleeding: Secondary | ICD-10-CM

## 2022-07-31 ENCOUNTER — Encounter: Payer: Self-pay | Admitting: Family Medicine

## 2022-07-31 DIAGNOSIS — B0229 Other postherpetic nervous system involvement: Secondary | ICD-10-CM

## 2022-08-02 MED ORDER — PREGABALIN 150 MG PO CAPS: 150.0000 mg | ORAL_CAPSULE | Freq: Three times a day (TID) | ORAL | 5 refills | Status: DC

## 2022-08-08 ENCOUNTER — Other Ambulatory Visit: Payer: Self-pay | Admitting: Internal Medicine

## 2022-08-08 DIAGNOSIS — F32A Depression, unspecified: Secondary | ICD-10-CM

## 2022-09-07 ENCOUNTER — Ambulatory Visit: Payer: BC Managed Care – PPO | Admitting: Family Medicine

## 2022-09-10 ENCOUNTER — Encounter: Payer: Self-pay | Admitting: Family Medicine

## 2022-09-10 ENCOUNTER — Ambulatory Visit (INDEPENDENT_AMBULATORY_CARE_PROVIDER_SITE_OTHER): Payer: 59 | Admitting: Family Medicine

## 2022-09-10 VITALS — BP 122/78 | HR 80 | Temp 97.8°F | Resp 16 | Ht 70.0 in | Wt 141.8 lb

## 2022-09-10 DIAGNOSIS — M545 Low back pain, unspecified: Secondary | ICD-10-CM | POA: Diagnosis not present

## 2022-09-10 DIAGNOSIS — L509 Urticaria, unspecified: Secondary | ICD-10-CM | POA: Diagnosis not present

## 2022-09-10 DIAGNOSIS — G4700 Insomnia, unspecified: Secondary | ICD-10-CM

## 2022-09-10 DIAGNOSIS — B0229 Other postherpetic nervous system involvement: Secondary | ICD-10-CM

## 2022-09-10 DIAGNOSIS — M542 Cervicalgia: Secondary | ICD-10-CM

## 2022-09-10 DIAGNOSIS — G8929 Other chronic pain: Secondary | ICD-10-CM | POA: Diagnosis not present

## 2022-09-10 MED ORDER — PREGABALIN 150 MG PO CAPS: 150.0000 mg | ORAL_CAPSULE | Freq: Three times a day (TID) | ORAL | 5 refills | Status: DC

## 2022-09-10 NOTE — Assessment & Plan Note (Signed)
On Lyrica 150 mg 3 times daily.  Will refill today.  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  Does occasionally get paresthesias as well.  We discussed trial of physical therapy however he declined as he did not feel like was particularly effective for his situation.  He is interested in seeing a different specialist at this point.  Will place referral to sports medicine.

## 2022-09-10 NOTE — Assessment & Plan Note (Signed)
Did not like the side effects with the trazodone.  Felt like it made him too drowsy.  Overall his insomnia does seem to be improving with the Lyrica and he would like to continue without any other additional medications at this point.

## 2022-09-10 NOTE — Assessment & Plan Note (Signed)
This is mostly doing better now.  He is on Lyrica 150 mg 3 times daily as above which seems to be helping.

## 2022-09-10 NOTE — Patient Instructions (Addendum)
It was very nice to see you today!  We will refer you to see sports medicine.  I will refill Lyrica.   You can use benadryl or hydrocortisone cream to the areas of swelling after you get bit by a mosquito.   Return in about 6 months (around 03/13/2023).   Take care, Dr Jimmey Ralph  PLEASE NOTE:  If you had any lab tests, please let us know if you have not heard back within a few days. You may see your results on mychart before we have a chance to review them but we will give you a call once they are reviewed by [place].   If we ordered any referrals today, please let us know if you have not heard from their office within the next week.   If you had any urgent prescriptions sent in today, please check with the pharmacy within an hour of our visit to make sure the prescription was transmitted appropriately.   Please try these tips to maintain a healthy lifestyle:  Eat at least 3 REAL meals and 1-2 snacks per day.  Aim for no more than 5 hours between eating.  If you eat breakfast, please do so within one hour of getting up.   Each meal should contain half fruits/vegetables, one quarter protein, and one quarter carbs (no bigger than a computer mouse)  Cut down on sweet beverages. This includes juice, soda, and sweet tea.   Drink at least 1 glass of water with each meal and aim for at least 8 glasses per day  Exercise at least 150 minutes every week.

## 2022-09-10 NOTE — Progress Notes (Signed)
   Wayne Long is a 32 y.o. male who presents today for an office visit.  Assessment/Plan:  Chronic Problems Addressed Today: Chronic neck pain On Lyrica 150 mg 3 times daily.  Will refill today.  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  Does occasionally get paresthesias as well.  We discussed trial of physical therapy however he declined as he did not feel like was particularly effective for his situation.  He is interested in seeing a different specialist at this point.  Will place referral to sports medicine.  Chronic low back pain This is mostly doing better now.  He is on Lyrica 150 mg 3 times daily as above which seems to be helping.  Insomnia Did not like the side effects with the trazodone.  Felt like it made him too drowsy.  Overall his insomnia does seem to be improving with the Lyrica and he would like to continue without any other additional medications at this point.  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  He can use hydrocortisone cream and Benadryl as needed.     Subjective:  HPI:  See Assessment / plan for status of chronic conditions. Patient here for Lyrica refill.  Currently on 150 mg 3 times daily.  He has had issues with chronic neck and back pain for the last several years. Has had ESI in the past without much benefit. He does get paresthesias in upper extremities if sitting in the same position for too long of a time. He does feel like he is doing better. He did have a trial of physical therapy but did not feel like if this was very helpful.  He does not wish to go back to [facility] but is interested in seeing a different specialist at this time.   He has also noticed he gets localized swelling after being bit by a mosquito.  This has been going on for multiple years.  No history of facial swelling or difficulty breathing.  We did see him a few months ago and  started him on trazodone for insomnia.  Did not feel like this was effective and additionally caused him to be too sedated.       Objective:  Physical Exam: BP 122/78   Pulse 80   Temp 97.8 F (36.6 C) (Temporal)   Resp 16   Ht 5\' 10"  (1.778 m)   Wt 141 lb 12.8 oz (64.3 kg)   SpO2 98%   BMI 20.35 kg/m   Gen: No acute distress, resting comfortably Neuro: Grossly normal, moves all extremities Psych: Normal affect and thought content      Japneet Staggs M. Jimmey Ralph, MD 09/10/2022 11:52 AM

## 2022-09-10 NOTE — Assessment & Plan Note (Signed)
No red flags.  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  He can use hydrocortisone cream and Benadryl as needed.

## 2022-09-13 ENCOUNTER — Ambulatory Visit: Payer: BC Managed Care – PPO | Admitting: Family Medicine

## 2022-09-13 NOTE — Progress Notes (Deleted)
   Rubin Payor, PhD, LAT, ATC acting as a scribe for Clementeen Graham, MD.  Wayne Long is a 32 y.o. male who presents to [facility] today for neck pain x ***. He's been seen previously for his neck and low back pain at [facility]. Pt locates pain to ***  Radiates:  UE Numbness/tingling: UE Weakness: Aggravates: Treatments tried: Lyrica  Dx imaging: 10/29/20 C-spine MRI  Pertinent review of systems: ***  Relevant historical information: ***   Exam:  There were no vitals taken for this visit. General: Well Developed, well nourished, and in no acute distress.   MSK: ***    Lab and Radiology Results No results found for this or any previous visit (from the past 72 hour(s)). No results found.     Assessment and Plan: 32 y.o. male with ***   PDMP not reviewed this encounter. No orders of the defined types were placed in this encounter.  No orders of the defined types were placed in this encounter.    Discussed warning signs or symptoms. Please see discharge instructions. Patient expresses understanding.   ***

## 2022-09-21 ENCOUNTER — Other Ambulatory Visit: Payer: Self-pay | Admitting: Family Medicine

## 2022-09-21 DIAGNOSIS — E559 Vitamin D deficiency, unspecified: Secondary | ICD-10-CM

## 2022-10-23 ENCOUNTER — Other Ambulatory Visit: Payer: Self-pay | Admitting: Family Medicine

## 2022-10-26 ENCOUNTER — Encounter: Payer: Self-pay | Admitting: Family Medicine

## 2022-10-26 DIAGNOSIS — B0229 Other postherpetic nervous system involvement: Secondary | ICD-10-CM

## 2022-10-29 MED ORDER — PREGABALIN 150 MG PO CAPS
150.0000 mg | ORAL_CAPSULE | Freq: Three times a day (TID) | ORAL | 5 refills | Status: AC
Start: 2022-10-29 — End: ?

## 2022-11-02 ENCOUNTER — Telehealth: Payer: Self-pay

## 2022-11-02 ENCOUNTER — Other Ambulatory Visit (HOSPITAL_COMMUNITY): Payer: Self-pay

## 2022-11-02 NOTE — Telephone Encounter (Signed)
Patient Advocate Encounter   Received notification from [employer] that prior authorization is required for Pregabalin 150MG  capsules   Submitted: 11-02-2022 Key [id number]  Status is pending

## 2022-11-05 ENCOUNTER — Emergency Department (HOSPITAL_BASED_OUTPATIENT_CLINIC_OR_DEPARTMENT_OTHER)
Admission: EM | Admit: 2022-11-05 | Discharge: 2022-11-05 | Disposition: A | Discharge status: Home / Self Care | Payer: 59 | Attending: Emergency Medicine | Admitting: Emergency Medicine

## 2022-11-05 ENCOUNTER — Emergency Department (HOSPITAL_BASED_OUTPATIENT_CLINIC_OR_DEPARTMENT_OTHER): Payer: 59

## 2022-11-05 ENCOUNTER — Encounter (HOSPITAL_BASED_OUTPATIENT_CLINIC_OR_DEPARTMENT_OTHER): Payer: Self-pay | Admitting: Emergency Medicine

## 2022-11-05 ENCOUNTER — Other Ambulatory Visit: Payer: Self-pay

## 2022-11-05 DIAGNOSIS — W500XXA Accidental hit or strike by another person, initial encounter: Secondary | ICD-10-CM | POA: Insufficient documentation

## 2022-11-05 DIAGNOSIS — S29001A Unspecified injury of muscle and tendon of front wall of thorax, initial encounter: Secondary | ICD-10-CM | POA: Diagnosis not present

## 2022-11-05 DIAGNOSIS — S20309A Unspecified superficial injuries of unspecified front wall of thorax, initial encounter: Secondary | ICD-10-CM | POA: Diagnosis not present

## 2022-11-05 DIAGNOSIS — J439 Emphysema, unspecified: Secondary | ICD-10-CM | POA: Diagnosis not present

## 2022-11-05 DIAGNOSIS — Y9372 Activity, wrestling: Secondary | ICD-10-CM | POA: Insufficient documentation

## 2022-11-05 DIAGNOSIS — R918 Other nonspecific abnormal finding of lung field: Secondary | ICD-10-CM | POA: Diagnosis not present

## 2022-11-05 DIAGNOSIS — S2222XA Fracture of body of sternum, initial encounter for closed fracture: Secondary | ICD-10-CM | POA: Diagnosis not present

## 2022-11-05 MED ORDER — HYDROCODONE-ACETAMINOPHEN 5-325 MG PO TABS
1.0000 | ORAL_TABLET | Freq: Once | ORAL | Status: AC
  Administered 2022-11-05: 1 via ORAL
  Filled 2022-11-05: qty 1

## 2022-11-05 MED ORDER — HYDROCODONE-ACETAMINOPHEN 5-325 MG PO TABS: 1.0000 | ORAL_TABLET | ORAL | 0 refills | Status: DC | PRN

## 2022-11-05 MED ORDER — KETOROLAC TROMETHAMINE 30 MG/ML IJ SOLN
30.0000 mg | Freq: Once | INTRAMUSCULAR | Status: AC
  Administered 2022-11-05: 30 mg via INTRAMUSCULAR
  Filled 2022-11-05: qty 1

## 2022-11-05 NOTE — Discharge Instructions (Addendum)
Evaluation today revealed that you do have a fracture of the upper portion of your sternum.  If you have worsening chest pain, shortness of breath or any other concerning symptom please return emergency dept for further evaluation.  Otherwise recommend that you follow-up with your PCP.  You can take Norco for acute pain in the next few days.  Advise recommend ice, Tylenol and ibuprofen.

## 2022-11-05 NOTE — ED Provider Notes (Signed)
[place] [facility] Provider Note   CSN: [id number] Arrival date & time: 11/05/22  1126     History  Chief Complaint  Patient presents with   Chest Pain   HPI Wayne Long is a 32 y.o. male with chronic low back pain presenting for chest pain.  Patient states that this morning he was wrestling with his 71 year old nephew.  His nephew accidentally hit him in the center of the chest with his head.  Sternal pain that is nonradiating.  States it is worse with breathing.  Shortness of breath.  States he took 3 Lyrica with no improvement and states that ibuprofen does not help with his pain so he did not try it.   Chest Pain      Home Medications Prior to Admission medications   Medication Sig Start Date End Date Taking? Authorizing Provider  cyclobenzaprine (FLEXERIL) 10 MG tablet Take 1 tablet (10 mg total) by mouth 3 (three) times daily as needed for muscle spasms. 07/05/20   Ardith Dark, MD  diclofenac (VOLTAREN) 75 MG EC tablet Take 1 tablet (75 mg total) by mouth 2 (two) times daily. 07/05/20   Ardith Dark, MD  omeprazole (PRILOSEC) 20 MG capsule TAKE 1 CAPSULE BY MOUTH EVERY DAY; TAPER OFF AS TOLERATED AFTER 15 DAYS 06/29/22   Lula Olszewski, MD  pregabalin (LYRICA) 150 MG capsule Take 1 capsule (150 mg total) by mouth in the morning, at noon, and at bedtime. 10/29/22   Ardith Dark, MD  Vitamin D, Ergocalciferol, (DRISDOL) 1.25 MG (50000 UNIT) CAPS capsule TAKE 1 CAPSULE BY MOUTH EVERY 7 DAYS 09/21/22   Ardith Dark, MD      Allergies    Patient has no known allergies.    Review of Systems   Review of Systems  Cardiovascular:  Positive for chest pain.    Physical Exam Updated Vital Signs BP 114/77 (BP Location: Right Arm)   Pulse 95   Temp 98.2 F (36.8 C) (Oral)   Resp (!) 21   SpO2 97%  Physical Exam Vitals and nursing note reviewed.  HENT:     Head: Normocephalic and atraumatic.     Mouth/Throat:     Mouth: Mucous  membranes are moist.  Eyes:     General:        Right eye: No discharge.        Left eye: No discharge.     Conjunctiva/sclera: Conjunctivae normal.  Cardiovascular:     Rate and Rhythm: Normal rate and regular rhythm.     Pulses: Normal pulses.     Heart sounds: Normal heart sounds.  Pulmonary:     Effort: Pulmonary effort is normal.     Breath sounds: Normal breath sounds.  Chest:     Chest wall: Tenderness present. No mass, deformity, swelling, crepitus or edema.    Abdominal:     General: Abdomen is flat.     Palpations: Abdomen is soft.  Skin:    General: Skin is warm and dry.  Neurological:     General: No focal deficit present.  Psychiatric:        Mood and Affect: Mood normal.     ED Results / Procedures / Treatments   Labs (all labs ordered are listed, but only abnormal results are displayed) Labs Reviewed - No data to display  EKG EKG Interpretation Date/Time:  Monday November 05 2022 11:35:45 EDT Ventricular Rate:  95 PR Interval:  113  QRS Duration:  80 QT Interval:  315 QTC Calculation: 396 R Axis:   78  Text Interpretation: Sinus rhythm Borderline short PR interval ST elevation suggests acute pericarditis No old tracing to compare Confirmed by Rolan Bucco [phone]) on 11/05/2022 12:27:29 PM  Radiology CT Chest Wo Contrast  Result Date: 11/05/2022 CLINICAL DATA:  Chest trauma, blunt EXAM: CT CHEST WITHOUT CONTRAST TECHNIQUE: Multidetector CT imaging of the chest was performed following the standard protocol without IV contrast. RADIATION DOSE REDUCTION: This exam was performed according to the departmental dose-optimization program which includes automated exposure control, adjustment of the mA and/or kV according to patient size and/or use of iterative reconstruction technique. COMPARISON:  None Available. FINDINGS: Cardiovascular: Normal cardiac size. No pericardial effusion. No aortic aneurysm. Mediastinum/Nodes: Visualized thyroid gland appears grossly  unremarkable. No solid / cystic mediastinal masses. The esophagus is nondistended precluding optimal assessment. No axillary or mediastinal lymphadenopathy by size criteria. Evaluation of bilateral hila is limited due to lack on intravenous contrast: however, no large hilar lymphadenopathy identified. Lungs/Pleura: The central tracheo-bronchial tree is patent. Azygous lobe and fissure noted. 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 No mass or consolidation. No pleural effusion or pneumothorax. There is a 2 mm calcified granuloma in the right lung upper lobe. No suspicious lung nodules. Upper Abdomen: Visualized upper abdominal viscera within normal limits. Musculoskeletal: The visualized soft tissues of the chest wall are grossly unremarkable. There is a linear minimally displaced fracture of the upper sternal body. There is no associated significant hematoma. No suspicious osseous lesions. IMPRESSION: 1. Linear minimally displaced fracture of the upper sternal body. No associated significant hematoma. 2. No other traumatic injury to the chest. 3. 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 Emphysema (ICD10-J43.9). Electronically Signed   By: Jules Schick M.D.   On: 11/05/2022 14:45   DG Chest 2 View  Result Date: 11/05/2022 CLINICAL DATA:  Anterior chest injury EXAM: CHEST - 2 VIEW COMPARISON:  None available. FINDINGS: Cardiomediastinal silhouette and pulmonary vasculature are within normal limits. Lungs are clear. IMPRESSION: No acute cardiopulmonary process. Electronically Signed   By: Acquanetta Belling M.D.   On: 11/05/2022 11:56    Procedures Procedures    Medications Ordered in ED Medications  ketorolac (TORADOL) 30 MG/ML injection 30 mg (has no  administration in time range)  HYDROcodone-acetaminophen (NORCO/VICODIN) 5-325 MG per tablet 1 tablet (has no administration in time range)  HYDROcodone-acetaminophen (NORCO/VICODIN) 5-325 MG per tablet 1 tablet (1 tablet Oral Given 11/05/22 1301)    ED Course/ Medical Decision Making/ A&P                             Medical Decision Making Amount and/or Complexity of Data Reviewed Radiology: ordered.  Risk Prescription drug management.   32 year old well-appearing male presenting for traumatic chest pain.  Exam notable for sternal tenderness but otherwise unremarkable. CT of the chest did reveal a mildly displaced fracture of the upper sternal body.  Likely the etiology of his pain.  Treated with Norco and Toradol.  Sent home with a few tablets of Norco for acute pain.  Advised to follow-up with PCP.  Discussed return precautions.  Vital stable discharge.  Discharged home.        Final Clinical Impression(s) / ED Diagnoses Final diagnoses:  Closed fracture of body of sternum, initial encounter  Rx / DC Orders ED Discharge Orders     None         Wayne Eagle, PA-C 11/05/22 1512    Rolan Bucco, MD 11/06/22 [phone]

## 2022-11-05 NOTE — ED Triage Notes (Signed)
Patient presents to ED via POV from home. Patient reports this morning he was wrestling with his nephew accidentally hit him in the chest with his head. Here with sternal pain.

## 2022-11-05 NOTE — ED Notes (Signed)
Discharge instructions reviewed with patient. Patient verbalizes understanding, no further questions at this time. Medications/prescriptions and follow up information provided. No acute distress noted at time of departure.  

## 2022-11-07 NOTE — Telephone Encounter (Signed)
Patient Advocate Encounter  Prior Authorization for Pregabalin 150mg  has been approved with [facility].    PA#  [id number] Effective dates: 11/02/22 through 11/02/23  Approval letter attached to charts

## 2022-11-08 ENCOUNTER — Ambulatory Visit: Payer: 59 | Admitting: Family Medicine

## 2022-11-09 ENCOUNTER — Ambulatory Visit (INDEPENDENT_AMBULATORY_CARE_PROVIDER_SITE_OTHER): Payer: 59 | Admitting: Family Medicine

## 2022-11-09 ENCOUNTER — Encounter: Payer: Self-pay | Admitting: Family Medicine

## 2022-11-09 VITALS — BP 119/73 | HR 86 | Temp 98.0°F | Ht 70.0 in | Wt 150.2 lb

## 2022-11-09 DIAGNOSIS — S2220XD Unspecified fracture of sternum, subsequent encounter for fracture with routine healing: Secondary | ICD-10-CM | POA: Diagnosis not present

## 2022-11-09 MED ORDER — OXYCODONE-ACETAMINOPHEN 10-325 MG PO TABS: 0.5000 | ORAL_TABLET | ORAL | 0 refills | Status: AC | PRN

## 2022-11-09 NOTE — Patient Instructions (Signed)
It was very nice to see you today!  We will send in pain meds.  Let us know in a week or 2 how you are doing.  Return if symptoms worsen or fail to improve.   Take care, Dr Jimmey Ralph  PLEASE NOTE:  If you had any lab tests, please let us know if you have not heard back within a few days. You may see your results on mychart before we have a chance to review them but we will give you a call once they are reviewed by [place].   If we ordered any referrals today, please let us know if you have not heard from their office within the next week.   If you had any urgent prescriptions sent in today, please check with the pharmacy within an hour of our visit to make sure the prescription was transmitted appropriately.   Please try these tips to maintain a healthy lifestyle:  Eat at least 3 REAL meals and 1-2 snacks per day.  Aim for no more than 5 hours between eating.  If you eat breakfast, please do so within one hour of getting up.   Each meal should contain half fruits/vegetables, one quarter protein, and one quarter carbs (no bigger than a computer mouse)  Cut down on sweet beverages. This includes juice, soda, and sweet tea.   Drink at least 1 glass of water with each meal and aim for at least 8 glasses per day  Exercise at least 150 minutes every week.

## 2022-11-09 NOTE — Progress Notes (Signed)
   Wayne Long is a 32 y.o. male who presents today for an office visit.  Assessment/Plan:  Sternal fracture Main concern today is pain control.  He has a reassuring exam.  Discussed with patient it will take several weeks for his fracture to heal and that his pain will probably persist for several weeks as well.  We did discuss referral to see orthopedics for follow up however he deferred for now.  Will send in small supply of oxycodone for pain control.  We discussed potential side effects.  He will follow-up with [place] next week via mychart.  May need repeat imaging or referral if pain does not improve as expected.  We discussed reasons to return to care and seek emergent care.    Subjective:  HPI:  See A/P for status of chronic conditions.  Patient is here for ED follow-up.  Went to the ED on 11/05/2022 with chest pain.  Had workup in the ED including imaging and EKG.  CT scan showed mildly displaced fracture of sternum.  He was given prescription for Norco and discharged home.  Over the last few days pain has been persistent.  Pain is making it difficult for him to sleep at night.  Also difficulty with coughing or sneezing.  Very hard to take deep breaths.  He is able to move around okay however this is difficult also.  The Norco has only given modest benefit for pain relief and he is now out of this prescription.  Other medications including his Lyrica have not improved his pain significantly.  Pain has been stable the last few days.  No worsening pain.  No shortness of breath.       Objective:  Physical Exam: BP 119/73   Pulse 86   Temp 98 F (36.7 C) (Temporal)   Ht 5\' 10"  (1.778 m)   Wt 150 lb 3.2 oz (68.1 kg)   SpO2 97%   BMI 21.55 kg/m   Gen: No acute distress, resting comfortably CV: Regular rate and rhythm with no murmurs appreciated Pulm: Normal work of breathing, clear to auscultation bilaterally with no crackles, wheezes, or rhonchi Neuro: Grossly normal, moves all  extremities Psych: Normal affect and thought content  Time Spent: 30 minutes of total time was spent on the date of the encounter performing the following actions: chart review prior to seeing the patient including recent Emergency Department visit, obtaining history, performing a medically necessary exam, counseling on the treatment plan, placing orders, and documenting in our EHR.        Katina Degree. Jimmey Ralph, MD 11/09/2022 2:55 PM

## 2022-11-19 ENCOUNTER — Ambulatory Visit (INDEPENDENT_AMBULATORY_CARE_PROVIDER_SITE_OTHER): Payer: 59 | Admitting: Family Medicine

## 2022-11-19 ENCOUNTER — Encounter: Payer: Self-pay | Admitting: Family Medicine

## 2022-11-19 ENCOUNTER — Telehealth: Payer: Self-pay | Admitting: Family Medicine

## 2022-11-19 VITALS — BP 110/60 | HR 96 | Temp 98.0°F | Ht 70.0 in | Wt 146.2 lb

## 2022-11-19 DIAGNOSIS — S2220XD Unspecified fracture of sternum, subsequent encounter for fracture with routine healing: Secondary | ICD-10-CM | POA: Diagnosis not present

## 2022-11-19 MED ORDER — OXYCODONE-ACETAMINOPHEN 10-325 MG PO TABS: 1.0000 | ORAL_TABLET | Freq: Three times a day (TID) | ORAL | 0 refills | Status: DC | PRN

## 2022-11-19 NOTE — Patient Instructions (Signed)
It was very nice to see you today!  I will refill your pain medications. Please let us know if this does not continue to improve.  We contact you with your CT scan results once they are back.   Return if symptoms worsen or fail to improve.   Take care, Dr Jimmey Ralph  PLEASE NOTE:  If you had any lab tests, please let us know if you have not heard back within a few days. You may see your results on mychart before we have a chance to review them but we will give you a call once they are reviewed by [place].   If we ordered any referrals today, please let us know if you have not heard from their office within the next week.   If you had any urgent prescriptions sent in today, please check with the pharmacy within an hour of our visit to make sure the prescription was transmitted appropriately.   Please try these tips to maintain a healthy lifestyle:  Eat at least 3 REAL meals and 1-2 snacks per day.  Aim for no more than 5 hours between eating.  If you eat breakfast, please do so within one hour of getting up.   Each meal should contain half fruits/vegetables, one quarter protein, and one quarter carbs (no bigger than a computer mouse)  Cut down on sweet beverages. This includes juice, soda, and sweet tea.   Drink at least 1 glass of water with each meal and aim for at least 8 glasses per day  Exercise at least 150 minutes every week.

## 2022-11-19 NOTE — Progress Notes (Signed)
   Wayne Long is a 32 y.o. male who presents today for an office visit.  Assessment/Plan:  Sternal Fracture Pain is improving over the last week or so.  Database was reviewed without red flags.  We will give another small supply of oxycodone for the next couple of weeks. Discussed that it may take several more weeks before this heals up completely. Depending on results of CT scan may need referral to orthopedics. We discussed reasons to return to care and seek emergent care.     Subjective:  HPI:  See Assessment / plan for status of chronic conditions.  Patient is here today for follow-up.  We saw him 10 days ago for sternal fracture.  At that point discussed pain control and supply of oxycodone was sent in.  Also discussed referral to orthopedics however he declined.Pain has been improving slightly. He can breathe and swallow better than he did a week ago. He was tolerating oxycodone well without side effects and requests a few more days be sent in. He does have an upcoming CT scan in a couple of weeks.       Objective:  Physical Exam: BP 110/60   Pulse 96   Temp 98 F (36.7 C)   Ht 5\' 10"  (1.778 m)   Wt 146 lb 3.2 oz (66.3 kg)   SpO2 98%   BMI 20.98 kg/m   Gen: No acute distress, resting comfortably Neuro: Grossly normal, moves all extremities Psych: Normal affect and thought content      Wayne Long M. Jimmey Ralph, MD 11/19/2022 2:21 PM

## 2022-11-19 NOTE — Telephone Encounter (Signed)
error 

## 2022-11-22 ENCOUNTER — Telehealth: Payer: Self-pay

## 2022-11-22 ENCOUNTER — Other Ambulatory Visit (HOSPITAL_COMMUNITY): Payer: Self-pay

## 2022-11-22 NOTE — Telephone Encounter (Signed)
Pharmacy Patient Advocate Encounter  Received notification from [facility] that Prior Authorization for Oxycodone/APAP 10-325 MG has been APPROVED from 11/22/22 to 05/25/23. Ran test claim, Copay is $10.94.  PA #/Case ID/Reference #: [id number]

## 2022-11-28 ENCOUNTER — Ambulatory Visit (HOSPITAL_BASED_OUTPATIENT_CLINIC_OR_DEPARTMENT_OTHER)
Admission: RE | Admit: 2022-11-28 | Discharge: 2022-11-28 | Disposition: A | Discharge status: Home / Self Care | Payer: 59 | Source: Ambulatory Visit | Attending: Family Medicine | Admitting: Family Medicine

## 2022-11-28 DIAGNOSIS — S2220XD Unspecified fracture of sternum, subsequent encounter for fracture with routine healing: Secondary | ICD-10-CM | POA: Insufficient documentation

## 2022-11-28 DIAGNOSIS — S2220XA Unspecified fracture of sternum, initial encounter for closed fracture: Secondary | ICD-10-CM | POA: Diagnosis not present

## 2022-11-28 DIAGNOSIS — J439 Emphysema, unspecified: Secondary | ICD-10-CM | POA: Diagnosis not present

## 2022-12-05 NOTE — Progress Notes (Signed)
His CT scan shows that he is having healing in his sternal fracture.  If he is having improvement in his pain he did not need to do anything further at this point.  If he is still having ongoing issues we may need to refer him to see an orthopedic surgeon.

## 2022-12-11 ENCOUNTER — Other Ambulatory Visit: Payer: Self-pay | Admitting: Family Medicine

## 2022-12-12 MED ORDER — OXYCODONE-ACETAMINOPHEN 10-325 MG PO TABS: 1.0000 | ORAL_TABLET | Freq: Three times a day (TID) | ORAL | 0 refills | Status: AC | PRN

## 2022-12-21 ENCOUNTER — Ambulatory Visit: Payer: 59 | Admitting: Family Medicine
